# Patient Record
Sex: Male | Born: 1950 | Race: White | Hispanic: No | Marital: Married | Smoking: Former smoker
Health system: Southern US, Community
[De-identification: ages and names within clinical notes are randomized; demographics above are authoritative.]

## PROBLEM LIST (undated history)

## (undated) DIAGNOSIS — I10 Essential (primary) hypertension: Secondary | ICD-10-CM

## (undated) DIAGNOSIS — E785 Hyperlipidemia, unspecified: Secondary | ICD-10-CM

## (undated) DIAGNOSIS — G4733 Obstructive sleep apnea (adult) (pediatric): Secondary | ICD-10-CM

## (undated) DIAGNOSIS — Z8679 Personal history of other diseases of the circulatory system: Secondary | ICD-10-CM

## (undated) HISTORY — DX: Obstructive sleep apnea (adult) (pediatric): G47.33

## (undated) HISTORY — PX: ANKLE SURGERY: SHX546

## (undated) HISTORY — DX: Hyperlipidemia, unspecified: E78.5

## (undated) HISTORY — DX: Personal history of other diseases of the circulatory system: Z86.79

## (undated) HISTORY — PX: BACK SURGERY: SHX140

## (undated) HISTORY — DX: Essential (primary) hypertension: I10

## (undated) HISTORY — PX: CHOLECYSTECTOMY: SHX55

## (undated) HISTORY — PX: OTHER SURGICAL HISTORY: SHX169

---

## 2004-04-15 ENCOUNTER — Ambulatory Visit: Payer: Self-pay | Admitting: Family Medicine

## 2004-04-29 ENCOUNTER — Ambulatory Visit: Payer: Self-pay | Admitting: Family Medicine

## 2004-07-19 ENCOUNTER — Ambulatory Visit: Payer: Self-pay | Admitting: Family Medicine

## 2004-12-13 ENCOUNTER — Ambulatory Visit: Payer: Self-pay | Admitting: Family Medicine

## 2005-04-07 ENCOUNTER — Ambulatory Visit: Payer: Self-pay | Admitting: Family Medicine

## 2005-04-29 ENCOUNTER — Ambulatory Visit: Payer: Self-pay | Admitting: Family Medicine

## 2005-07-15 ENCOUNTER — Ambulatory Visit: Payer: Self-pay | Admitting: Family Medicine

## 2009-03-02 ENCOUNTER — Encounter: Payer: Self-pay | Admitting: Pulmonary Disease

## 2010-04-26 ENCOUNTER — Encounter: Payer: Self-pay | Admitting: Pulmonary Disease

## 2010-05-31 ENCOUNTER — Telehealth (INDEPENDENT_AMBULATORY_CARE_PROVIDER_SITE_OTHER): Payer: Self-pay | Admitting: *Deleted

## 2010-05-31 ENCOUNTER — Ambulatory Visit
Admission: RE | Admit: 2010-05-31 | Discharge: 2010-05-31 | Payer: Self-pay | Source: Home / Self Care | Attending: Pulmonary Disease | Admitting: Pulmonary Disease

## 2010-05-31 DIAGNOSIS — I10 Essential (primary) hypertension: Secondary | ICD-10-CM | POA: Insufficient documentation

## 2010-05-31 DIAGNOSIS — R0989 Other specified symptoms and signs involving the circulatory and respiratory systems: Secondary | ICD-10-CM

## 2010-05-31 DIAGNOSIS — E785 Hyperlipidemia, unspecified: Secondary | ICD-10-CM | POA: Insufficient documentation

## 2010-05-31 DIAGNOSIS — G4733 Obstructive sleep apnea (adult) (pediatric): Secondary | ICD-10-CM | POA: Insufficient documentation

## 2010-05-31 DIAGNOSIS — I4891 Unspecified atrial fibrillation: Secondary | ICD-10-CM | POA: Insufficient documentation

## 2010-05-31 DIAGNOSIS — R0609 Other forms of dyspnea: Secondary | ICD-10-CM | POA: Insufficient documentation

## 2010-05-31 DIAGNOSIS — J45909 Unspecified asthma, uncomplicated: Secondary | ICD-10-CM | POA: Insufficient documentation

## 2010-06-20 NOTE — Assessment & Plan Note (Signed)
Summary: consult for possible asthma, doe   Visit Type:  Initial Consult Copy to:  Rudolpho Sevin [facility] Cardiology) Primary Provider/Referring Provider:  Orson Gear MD  CC:  Pulmonary Consult. Pt being evalutaed for asthma. Pt states he has increased sob with activity, wheezing, and chest tightness x 3 years. .  History of Present Illness: The pt is a 59y/o male who I have been asked to see for possible asthma and dyspnea.  The pt has a h/o afib, and tells me he has noticed worsening sob since his ablation doen in 2005.  He describes a one block doe at a moderate pace on flat ground, and will get winded bringing groceries in from the car.  He denies any sob at rest.  He states that his sob has progressed to the point that it is hard for him to do his job.  He has some cough in the am's and late evenings, and it is primarily dry with scant nonpurulet mucus.  He is on quinapril currently, and has occasional reflux symptoms.  He was given qvar emperically for possible asthma, and thinks this may have helped his symptoms to some degree?  He has had pfts in 2011 which show no airflow obstruction, very mild restriction, and an essentially normal DLCO.  There is slight airtrapping on lung volumes with RV/TLC of unknown significance.  It should be noted the pt was on qvar at the time of his pfts, per his history.  His last cxr available to me was in 2010, and showed only mildly prominent BV markings.   He has not had a recent cardiac evaluation per his history.   Preventive Screening-Counseling & Management  Alcohol-Tobacco     Smoking Status: quit  Current Medications (verified): 1)  Flonase 50 Mcg/act  Susp (Fluticasone Propionate) .... Two Puffs Each Nostril Daily 2)  Qvar 80 Mcg/act  Aers (Beclomethasone Dipropionate) .... Two  Puffs Twice Daily 3)  Sotalol Hcl 80 Mg Tabs (Sotalol Hcl) .... Take 1/2 Tab By Mouth Two Times A Day 4)  Quinapril Hcl 10 Mg Tabs (Quinapril Hcl) .... Take 1 Tablet By  Mouth Two Times A Day 5)  Klor-Con 10 10 Meq Cr-Tabs (Potassium Chloride) .... Take 1 Tablet By Mouth Once A Day 6)  Hydrochlorothiazide 25 Mg Tabs (Hydrochlorothiazide) .... Take 1 Tablet By Mouth Once A Day 7)  Aspirin Low Dose 81 Mg Tabs (Aspirin) .... Take 1 Tablet By Mouth Once A Day 8)  Lipitor 40 Mg Tabs (Atorvastatin Calcium) .... Once Daily  Allergies (verified): No Known Drug Allergies  Past History:  Past Medical History:  OBSTRUCTIVE SLEEP APNEA (ICD-327.23)-on cpap Hx of ATRIAL FIBRILLATION (ICD-427.31)--s/p ablation 11/05 HYPERLIPIDEMIA (ICD-272.4) HYPERTENSION (ICD-401.9)     Past Surgical History: Cholecystectomy back surgery left ankle surgery ablasion from afib x 5  Family History: Reviewed history and no changes required. Heart disease: mother, father, mgm, mgf, pgf, pgm paternal aunt: ovarian cancer mgm, maternal aunt: breast cancer paternal aunt: pancreatic cancer  Social History: Reviewed history and no changes required. Patient states former smoker. quit in 1970's. 1/2 ppd. started in 1960. married Clinical research associate at [employer] Smoking Status:  quit  Review of Systems       The patient complains of shortness of breath with activity, shortness of breath at rest, productive cough, irregular heartbeats, and nasal congestion/difficulty breathing through nose.  The patient denies non-productive cough, coughing up blood, chest pain, acid heartburn, indigestion, loss of appetite, weight change, abdominal pain, difficulty swallowing, sore  throat, tooth/dental problems, headaches, sneezing, itching, ear ache, anxiety, depression, hand/feet swelling, joint stiffness or pain, rash, change in color of mucus, and fever.    Vital Signs:  Patient profile:   60 year old male Height:      68 inches Weight:      216.13 pounds BMI:     32.98 O2 Sat:      96 % on Room air Temp:     98.7 degrees F oral Pulse rate:   68 / minute BP sitting:   120  / 76  (left arm) Cuff size:   large  Vitals Entered By: Carver Fila (May 31, 2010 9:40 AM)  O2 Flow:  Room air CC: Pulmonary Consult. Pt being evalutaed for asthma. Pt states he has increased sob with activity, wheezing, chest tightness x 3 years.  Comments meds and alleries updated Phone number updated Carver Fila  May 31, 2010 9:40 AM    Physical Exam  General:  overweight male with centripetal obesity in nad Eyes:  anisocoria and strabismus.   Nose:  patent without discharge mildly narrowed. Mouth:  clear, no exudates or lesions seen Neck:  no jvd, tmg, LN Lungs:  totally clear to auscultation Heart:  rrr, no mrg Abdomen:  soft and nontender, bs+ Extremities:  mild edema, no cyanosis  pulses intact distally Neurologic:  alert and oriented, moves all 4.   Impression & Recommendations:  Problem # 1:  DYSPNEA ON EXERTION (ICD-786.09) the pt noted significant doe of unknown origin.  It is unclear if this is from a pulmonary source, but his exam and recent pfts are really unremarkable.  The question has been raised whether he may have asthma, but he really has not had a significant response to qvar.  I think he needs to have a methacholine challenge test to put this issue to rest.  I suspect that his obesity and deconditioning are contributing significantly to his symptoms.  My only other thought is that he is on quinapril, and sometimes upper airway dysfunction can lead to the "perception" of dyspnea.  I think it would be worthwhile getting him off ACE for period to time to see if it helps.    Medications Added to Medication List This Visit: 1)  Lipitor 40 Mg Tabs (Atorvastatin calcium) .... Once daily  Other Orders: Consultation Level V (01027) Pulmonary Referral (Pulmonary)  Patient Instructions: 1)  stop quinapril, and take benicar 20mg  1/2 tab each day in its place.  if blood pressure goes up, can increase to one a day 2)  stop qvar 3)  can take ventolin inhaler  2 puffs up to every 6hrs if needed for emergencies only 4)  will schedule for methacholine challenge testing in 3 weeks once qvar gets out of your system.  Will call to schedule followup once results available.   Immunization History:  Influenza Immunization History:    Influenza:  historical (01/17/2010)  Pneumovax Immunization History:    Pneumovax:  historical (02/17/2007)

## 2010-06-20 NOTE — Progress Notes (Signed)
  Phone Note Other Incoming   Request: Send information Summary of Call: Records received from [facility]. 26 pages forwarded to Dr. Shelle Iron for review.

## 2010-06-21 ENCOUNTER — Ambulatory Visit (HOSPITAL_COMMUNITY)
Admission: RE | Admit: 2010-06-21 | Discharge: 2010-06-21 | Disposition: A | Discharge status: Home / Self Care | Payer: Managed Care, Other (non HMO) | Source: Ambulatory Visit | Attending: Pulmonary Disease | Admitting: Pulmonary Disease

## 2010-06-21 ENCOUNTER — Encounter: Payer: Self-pay | Admitting: Pulmonary Disease

## 2010-06-21 DIAGNOSIS — R0602 Shortness of breath: Secondary | ICD-10-CM | POA: Insufficient documentation

## 2010-06-24 ENCOUNTER — Telehealth (INDEPENDENT_AMBULATORY_CARE_PROVIDER_SITE_OTHER): Payer: Self-pay | Admitting: *Deleted

## 2010-06-26 NOTE — Procedures (Signed)
Summary: Orson Gear MD  Orson Gear MD   Imported By: Lester [place] 06/18/2010 11:19:59  _____________________________________________________________________  External Attachment:    Type:   Image     Comment:   External Document

## 2010-06-28 ENCOUNTER — Encounter: Payer: Self-pay | Admitting: Pulmonary Disease

## 2010-06-28 ENCOUNTER — Ambulatory Visit (INDEPENDENT_AMBULATORY_CARE_PROVIDER_SITE_OTHER): Payer: Managed Care, Other (non HMO) | Admitting: Pulmonary Disease

## 2010-06-28 DIAGNOSIS — J45909 Unspecified asthma, uncomplicated: Secondary | ICD-10-CM

## 2010-07-04 NOTE — Progress Notes (Signed)
Summary: questions about his inhaler    Phone Note Call from Patient Call back at [phone]   Caller: Spouse//carol Call For: clance Summary of Call: States that pt was put on his emergency inhaler wants to know is he can resume using his regular inhaler pls advise. Initial call taken by: Darletta Moll,  June 24, 2010 3:21 PM  Follow-up for Phone Call        Spoke with pt wife and she states pt had a difficult time at end of methacoline test. he couldnt stop coughing and had increased SOB. She states he has continued to have increased dry cough and chest tightness and SOB since test and has had to increase use of rescue inhaler. She wants to know can he go back on the QVAR or what [facility] recs are. Methacoline test was done on 06-21-10.  Please advise.Carron Curie CMA  June 24, 2010 4:02 PM   Additional Follow-up for Phone Call Additional follow up Details #1::        can use rescue inhaler as needed have not seen results of meth challenge....can we get these faxed over?? Additional Follow-up by: [name] MD,  June 24, 2010 5:44 PM    Additional Follow-up for Phone Call Additional follow up Details #2::    Pt's wife instructed that pt is to use rescue inhaler as needed per Dr Shelle Iron.  [facility] called for results of Methacolin Challenge.  They are faxing results for Dr Shelle Iron to reiview.  Results placed in Dr Teddy Spike look at stack. Abigail Miyamoto RN  June 25, 2010 8:56 AM   Additional Follow-up for Phone Call Additional follow up Details #3:: Details for Additional Follow-up Action Taken: Aundra Millet, we need to get this pt in this week to discuss his meth challenge, and to get him started on a new med. ok to let him know it was positive.   [id number].  Aundra Millet Reynolds LPN  June 25, 2010 2:29 PM   Spoke with pt's spouse and advised that pt needs appt this wk to dicuss results.  She states that he is unable to come in this wk b/c can not take off of work.  KC's next  available is not until 07/16/10.  Pls advise if we can work him in sooner.  Spouse states that if we can work him in, early pm is best time.  Pls advise thanks! Vernie Murders  June 26, 2010 9:51 AM  called and spoke with pt's wife.  wife states pt will be off next friday and requests an appt then.  pt scheduled for 07-05-2010 at 9:45am.  Arman Filter LPN  June 26, 2010 10:10 AM   Additional Follow-up by: [name] MD,  June 25, 2010 2:25 PM

## 2010-07-04 NOTE — Assessment & Plan Note (Signed)
Summary: rov to discuss meth. challenge results.   Copy to:  Rudolpho Sevin [facility] Cardiology) Primary Provider/Referring Provider:  Orson Gear MD  CC:  Ov to discuss MCT results.  Denies changes to breathing- still sob with exertion.  Pt requests a rx for the Benicar. Marland Kitchen  History of Present Illness: the pt comes in today for f/u of his methacholine challenge test, ordered as part of his w/u for doe.  He was found to have a 22% decrease in FVC with the 1.0 dilution of methacholine.  +improvement with albuterol, but not back to normal.  I have reviewed the study in detail with him, and answered all of his questions.  He was also taken off ACE last visit, and has seen improvement in his cough/upper airway irritation.    Current Medications (verified): 1)  Flonase 50 Mcg/act  Susp (Fluticasone Propionate) .... Two Puffs Each Nostril Daily 2)  Sotalol Hcl 80 Mg Tabs (Sotalol Hcl) .... Take 1/2 Tab By Mouth Two Times A Day 3)  Benicar 20 Mg Tabs (Olmesartan Medoxomil) .... 1/2 Tab By Mouth Daily 4)  Klor-Con 10 10 Meq Cr-Tabs (Potassium Chloride) .... Take 1 Tablet By Mouth Once A Day 5)  Hydrochlorothiazide 25 Mg Tabs (Hydrochlorothiazide) .... Take 1 Tablet By Mouth Once A Day 6)  Aspirin Low Dose 81 Mg Tabs (Aspirin) .... Take 1 Tablet By Mouth Once A Day 7)  Lipitor 40 Mg Tabs (Atorvastatin Calcium) .... Once Daily 8)  Ventolin Hfa 108 (90 Base) Mcg/act  Aers (Albuterol Sulfate) .Marland Kitchen.. 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  The patient denies  shortness of breath at rest, coughing up blood, chest pain, irregular heartbeats, acid heartburn, indigestion, loss of appetite, weight change, abdominal pain, difficulty swallowing, sore throat, tooth/dental problems, headaches, nasal congestion/difficulty breathing through nose, itching, ear ache, anxiety, depression, hand/feet swelling, rash, change in color of mucus, and fever.    Vital Signs:  Patient profile:   60 year old male Height:      68 inches Weight:      223.25 pounds BMI:     34.07 O2 Sat:      95 % on Room air Temp:     97.9 degrees F oral Pulse rate:   67 / minute BP sitting:   134 / 90  (right arm) Cuff size:   large  Vitals Entered By: Arman Filter LPN (June 28, 2010 3:38 PM)  O2 Flow:  Room air CC: Ov to discuss MCT results.  Denies changes to breathing- still sob with exertion.  Pt requests a rx for the Benicar.  Comments Medications reviewed with patient Arman Filter LPN  June 28, 2010 3:43 PM    Physical Exam  General:  wd male in nad Lungs:  clear to auscultation Heart:  rrr Extremities:  mild edema, no cyanosis  Neurologic:  alert and oriented, moves all 4.    Impression & Recommendations:  Problem # 1:  ASTHMA (ICD-493.90) the pt has a positive methacholine challenge test, and I suspect asthma is playing a role with his symptoms.  Will treat him with a LABA/ICS combo first,  then decrease to ICS alone if doing well.  I have discussed with him the pathophysiology of asthma, and how it will require daily maintenance med even if he is feeling good.  The pt also has seen improvement in his cough and upper airway symptoms since being off ACE.  Problem # 2:  DYSPNEA ON EXERTION (ICD-786.09) I suspect that weight and deconditioning are also playing a role in his symptoms.  I will be curious to see how he does once his asthma is treated more aggressively.    Medications Added to Medication List This Visit: 1)  Benicar 20 Mg Tabs (Olmesartan  medoxomil) .... 1/2 tab by mouth daily 2)  Ventolin Hfa 108 (90 Base) Mcg/act Aers (Albuterol sulfate) .Marland Kitchen.. 1-2 puffs every 4-6 hours as needed 3)  Dulera 100-5 Mcg/act Aero (Mometasone furo-formoterol fum) .... 2 puffs twice per day  Other Orders: Est. Patient Level III (29562)  Patient Instructions: 1)  will treat with dulera 100/5  2 puffs am and pm everyday...rinse mouth well. 2)  can use albuterol inhaler as needed, but call me if having to use more than 2 times a week 3)  stay on benicar 20mg  1/2 tab once a day, but can increase to one whole tab a day if blood pressure is not controlled.  Please get with Dr. Benedetto Goad or your cardiologist to get this ok'ed. 4)  work on weight loss and conditioning. 5)  please call me in 2-3 weeks with your progress.  If doing well, will see you back in 3mos  Prescriptions: DULERA 100-5 MCG/ACT AERO (MOMETASONE FURO-FORMOTEROL FUM) 2 puffs twice per day  #1 x 6   Entered and Authorized by:   [name] MD   Signed by:   [name] MD on 06/28/2010   Method used:   Print then Give to Patient   RxID:   [id number]

## 2010-07-05 ENCOUNTER — Ambulatory Visit: Payer: Managed Care, Other (non HMO) | Admitting: Pulmonary Disease

## 2010-08-28 ENCOUNTER — Telehealth: Payer: Self-pay | Admitting: Pulmonary Disease

## 2010-08-28 NOTE — Telephone Encounter (Signed)
Spoke w/ Okey Regal and she was wanting to know if pt should continue the qvar or the dulera. According to OV note on 06/28/10 [facility] was treating pt w/ dulera and pt was given 6 refills. Okey Regal states that was all she needed to know and nothing further was needed

## 2010-08-29 ENCOUNTER — Telehealth: Payer: Self-pay | Admitting: Pulmonary Disease

## 2010-08-29 MED ORDER — ALBUTEROL SULFATE HFA 108 (90 BASE) MCG/ACT IN AERS: INHALATION_SPRAY | RESPIRATORY_TRACT | Status: DC

## 2010-08-29 NOTE — Telephone Encounter (Signed)
lmomtcb x1 

## 2010-08-29 NOTE — Telephone Encounter (Signed)
Spoke w/ pt wife and she states they had company over last night and the kids where into pt's things and now pt can't find his emergency inhaler. Pt needs his rescue inhaler called in bc they tried to find it today and could not. Advised pt wife rx was sent to pharmacy

## 2010-09-25 ENCOUNTER — Encounter: Payer: Self-pay | Admitting: Pulmonary Disease

## 2010-09-27 ENCOUNTER — Encounter: Payer: Self-pay | Admitting: Pulmonary Disease

## 2010-09-27 ENCOUNTER — Ambulatory Visit (INDEPENDENT_AMBULATORY_CARE_PROVIDER_SITE_OTHER): Payer: Managed Care, Other (non HMO) | Admitting: Pulmonary Disease

## 2010-09-27 VITALS — BP 114/72 | HR 72 | Temp 97.5°F | Ht 68.0 in | Wt 221.8 lb

## 2010-09-27 DIAGNOSIS — J45909 Unspecified asthma, uncomplicated: Secondary | ICD-10-CM

## 2010-09-27 NOTE — Progress Notes (Signed)
  Subjective:    Patient ID: Christopher Newman, male    DOB: October 18, 1950, 60 y.o.   MRN: [id number]  HPI The pt comes in today for f/u of his known asthma and doe.  He was started on dulera last visit, and feels his breathing is much better.  He has not had any "episodes", and rarely uses his rescue inhaler.  He still has some doe, and I suspect his deconditioning and weight are playing a role here.    Review of Systems  Constitutional: Negative for fever and unexpected weight change.  HENT: Positive for rhinorrhea and sneezing. Negative for ear pain, nosebleeds, congestion, sore throat, trouble swallowing, dental problem, postnasal drip and sinus pressure.   Eyes: Negative for redness and itching.  Respiratory: Positive for cough, shortness of breath and wheezing. Negative for chest tightness.   Cardiovascular: Positive for palpitations. Negative for leg swelling.  Gastrointestinal: Positive for nausea. Negative for vomiting.  Genitourinary: Negative for dysuria.  Musculoskeletal: Negative for joint swelling.  Skin: Negative for rash.  Neurological: Negative for headaches.  Hematological: Does not bruise/bleed easily.  Psychiatric/Behavioral: Negative for dysphoric mood. The patient is not nervous/anxious.        Objective:   Physical Exam Ow male in nad Chest clear to auscultation  Cor with rrr LE with no edema, no cyanosis  Alert and oriented, moves all 4        Assessment & Plan:

## 2010-09-27 NOTE — Assessment & Plan Note (Addendum)
The pt is doing better with his doe, and rarely uses his rescue inhaler.  He feels his doe is 40% better since starting dulera.  I think his asthma is well controlled, and that his persistent dyspnea is due to his obesity, deconditioning, and possibly his cardiac issue.  He is to followup with me in one year.

## 2010-09-27 NOTE — Patient Instructions (Addendum)
No change in asthma meds Work on weight loss and conditioning program followup with me in 6mos

## 2011-03-21 ENCOUNTER — Telehealth: Payer: Self-pay | Admitting: Pulmonary Disease

## 2011-03-21 ENCOUNTER — Other Ambulatory Visit: Payer: Self-pay | Admitting: Pulmonary Disease

## 2011-03-21 DIAGNOSIS — G4730 Sleep apnea, unspecified: Secondary | ICD-10-CM

## 2011-03-21 NOTE — Telephone Encounter (Signed)
Let pt know that I do not follow him for sleep apnea.  If Dr. Benedetto Goad wishes me to manage this issue, then she needs to formally refer the pt to me for sleep apnea.  In the interim, I am ok with ordering a mask just this one time.

## 2011-03-21 NOTE — Telephone Encounter (Signed)
Spoke with pt's spouse Okey Regal. She states that pt's CPAP mask has a tear in it and he needs order for new mask asap. I advised that he sees [facility] for pulmonary issues and not sleep and so will have to ask if this is okay. She states that she thought that Dr. Benedetto Goad started on CPAP, but when she called there to get order from her, she advised to call us for the order. Please advise, thanks!

## 2011-03-24 NOTE — Telephone Encounter (Signed)
[id number].Christopher Newman, CMA  

## 2011-03-24 NOTE — Telephone Encounter (Signed)
ATC line was busy, [facility]

## 2011-03-24 NOTE — Telephone Encounter (Signed)
SPOUSE CAROL RETURNED CALL FROM TRIAGE. [phone]. Christopher Newman

## 2011-03-25 NOTE — Telephone Encounter (Signed)
Pt's spouse aware KC will send an order for the CPAP mask this time only. She verbalized understanding that if Dr. Benedetto Goad wishes for [facility] to follow the pt's sleep apnea, she will need to refer the pt to [facility] for that issue. Order sent to [facility].

## 2011-04-04 ENCOUNTER — Ambulatory Visit: Payer: Managed Care, Other (non HMO) | Admitting: Pulmonary Disease

## 2011-04-18 ENCOUNTER — Ambulatory Visit: Payer: Managed Care, Other (non HMO) | Admitting: Pulmonary Disease

## 2011-05-30 ENCOUNTER — Ambulatory Visit (INDEPENDENT_AMBULATORY_CARE_PROVIDER_SITE_OTHER): Payer: Managed Care, Other (non HMO) | Admitting: Pulmonary Disease

## 2011-05-30 ENCOUNTER — Encounter: Payer: Self-pay | Admitting: Pulmonary Disease

## 2011-05-30 VITALS — BP 120/80 | HR 74 | Temp 97.5°F | Ht 68.0 in | Wt 219.4 lb

## 2011-05-30 DIAGNOSIS — J45909 Unspecified asthma, uncomplicated: Secondary | ICD-10-CM

## 2011-05-30 MED ORDER — PREDNISONE (PAK) 10 MG PO TABS: ORAL_TABLET | ORAL | Status: DC

## 2011-05-30 NOTE — Assessment & Plan Note (Signed)
The patient had been doing well on his current asthma regimen, however had a recent episode of acute bronchitis.  He is now having a little more trouble with airflow and dyspnea on exertion.  I suspect he has ongoing airway inflammation that has not returned to baseline after his recent infection.  Ultram was short course of prednisone, and Avastin to continue with his inhaler regimen for maintenance.  I have also encouraged him to work aggressively on weight loss.

## 2011-05-30 NOTE — Patient Instructions (Signed)
Stay on current inhalers. Prednisone as directed Can take chlorpheniramine 8mg  at bedtime for postnasal drip followup with me in 6mos if you are doing well.

## 2011-05-30 NOTE — Progress Notes (Signed)
  Subjective:    Patient ID: Christopher Newman, male    DOB: 1951-02-21, 61 y.o.   MRN: [id number]  HPI The patient comes in today for followup of his known asthma.  He had been doing well until the end of December when he developed acute bronchitis.  He was treated with an antibiotic, but his breathing has not returned to baseline.  He has had some episodes of chest tightness that has required him to use his rescue inhaler.  Part of the issues that he has been under his house doing work, and exposed to various particulate matter.  He also has not made a lot of progress with weight loss, and is not doing any type of conditioning program.   Review of Systems  Constitutional: Negative for fever and unexpected weight change.  HENT: Negative for ear pain, nosebleeds, congestion, sore throat, rhinorrhea, sneezing, trouble swallowing, dental problem, postnasal drip and sinus pressure.   Eyes: Positive for pain. Negative for redness and itching.  Respiratory: Positive for cough, chest tightness, shortness of breath and wheezing.   Cardiovascular: Positive for palpitations. Negative for leg swelling.  Gastrointestinal: Negative for nausea and vomiting.  Genitourinary: Negative for dysuria.  Musculoskeletal: Negative for joint swelling.  Skin: Negative for rash.  Neurological: Negative for headaches.  Hematological: Does not bruise/bleed easily.  Psychiatric/Behavioral: Negative for dysphoric mood. The patient is not nervous/anxious.        Objective:   Physical Exam Overweight male in no acute distress Nose without purulence or discharge noted Chest with good air flow bilaterally, no wheezes.  Occasional rhonchi Cardiac exam with regular rate and rhythm Lower extremities no significant edema, no cyanosis noted Alert and oriented, moves all 4 extremities.       Assessment & Plan:

## 2011-05-30 NOTE — Progress Notes (Signed)
Addended by: Julaine Hua on: 05/30/2011 10:48 AM   Modules accepted: Orders

## 2011-07-04 ENCOUNTER — Other Ambulatory Visit: Payer: Self-pay | Admitting: Pulmonary Disease

## 2011-08-13 ENCOUNTER — Encounter: Payer: Self-pay | Admitting: Internal Medicine

## 2011-08-13 ENCOUNTER — Telehealth: Payer: Self-pay | Admitting: Internal Medicine

## 2011-08-13 ENCOUNTER — Encounter: Payer: Self-pay | Admitting: *Deleted

## 2011-08-13 ENCOUNTER — Ambulatory Visit (INDEPENDENT_AMBULATORY_CARE_PROVIDER_SITE_OTHER)
Admission: RE | Admit: 2011-08-13 | Discharge: 2011-08-13 | Disposition: A | Discharge status: Home / Self Care | Payer: Managed Care, Other (non HMO) | Source: Ambulatory Visit | Attending: Internal Medicine | Admitting: Internal Medicine

## 2011-08-13 ENCOUNTER — Ambulatory Visit (INDEPENDENT_AMBULATORY_CARE_PROVIDER_SITE_OTHER): Payer: Managed Care, Other (non HMO) | Admitting: Internal Medicine

## 2011-08-13 VITALS — BP 132/78 | HR 91 | Temp 99.1°F | Ht 69.0 in | Wt 222.0 lb

## 2011-08-13 DIAGNOSIS — J45909 Unspecified asthma, uncomplicated: Secondary | ICD-10-CM

## 2011-08-13 MED ORDER — DOXYCYCLINE HYCLATE 100 MG PO TABS: 100.0000 mg | ORAL_TABLET | Freq: Two times a day (BID) | ORAL | Status: DC

## 2011-08-13 MED ORDER — TRAMADOL HCL 50 MG PO TABS: ORAL_TABLET | ORAL | Status: AC

## 2011-08-13 MED ORDER — PREDNISONE (PAK) 10 MG PO TABS: ORAL_TABLET | ORAL | Status: DC

## 2011-08-13 NOTE — Progress Notes (Signed)
Quick Note:  Spoke with pt and notified of results per Dr. Wert. Pt verbalized understanding and denied any questions.  ______ 

## 2011-08-13 NOTE — Progress Notes (Addendum)
Subjective:     Patient ID: Christopher Newman, male   DOB: 05-24-1950   MRN: [id number]  HPI  39 yowm remote smoker  with dx of ACEI induced cough 05/2010 and dx  of asthma on MCT 2/012 ? (On sotalol at the time) started on dulera 100 2bid but breathing "never back to normal" even with use of ventolin prn and continued sotalol at 40 mg bid dosing.   08/13/2011 Saryah Loper/ acute w/in ov for Dr Shelle Iron c/o Increased SOB mostly with activity and cough x 4 days- cough prod with large amounts of clear to yellow sputum. Had fever to 101.5 one night prior to ov and increase sob requiring rescue saba up to 4 x in 24 h with some improvement s cp/ sore throat, rigors, n or v or D or arthralgias/ myalgias.      Review of Systems     Objective:   Physical Exam Obese amb wm nad  Wt 222 08/13/2011   HEENT: nl dentition, turbinates, and orophanx. Nl external ear canals without cough reflex   NECK :  without JVD/Nodes/TM/ nl carotid upstrokes bilaterally   LUNGS: no acc muscle use,  insp and exp rhonchi bilaterally though air movement ok   CV:  RRR  no s3 or murmur or increase in P2, no edema   ABD:  soft and nontender with nl excursion in the supine position. No bruits or organomegaly, bowel sounds nl  MS:  warm without deformities, calf tenderness, cyanosis or clubbing  SKIN: warm and dry without lesions    NEURO:  alert, approp, no deficits   CXR  08/13/2011 : slt increased markings, no def as dz     Assessment:          Plan:

## 2011-08-13 NOTE — Patient Instructions (Signed)
Your sotalol may be contributing to your breathing problems but for now would not change the dose - will send Dr Rudolpho Sevin a copy of my recs  Doxycyline 100 mg twice daily x 7 days  Prednisone 10 mg take  4 each am x 2 days,   2 each am x 2 days,  1 each am x2days and stop  For cough or pain take tramadol 50 mg 1-2  Up to every 4 hours as needed  Please remember to go to the x-ray department downstairs for your tests - we will call you with the results when they are available.    Christopher Newman

## 2011-08-13 NOTE — Assessment & Plan Note (Addendum)
DDX of  difficult airways managment all start with A and  include Adherence, Ace Inhibitors, Acid Reflux, Active Sinus Disease, Alpha 1 Antitripsin deficiency, Anxiety masquerading as Airways dz,  ABPA,  allergy(esp in young), Aspiration (esp in elderly), Adverse effects of DPI,  Active smokers, plus two Bs  = Bronchiectasis and Beta blocker use..and one C= CHF  Beta blocker(sotalol)  use  may be problematic in this setting though apparently has been tolerated well albeit on dulera.  Will defer to Dr Sandy Salaam  For now rx as uri with doxy and round of prednisone and control cough/ uri symptoms with ultram since afraid to use otcs due to heart rhythm issues

## 2011-08-14 ENCOUNTER — Telehealth: Payer: Self-pay | Admitting: Pulmonary Disease

## 2011-08-14 MED ORDER — LEVOFLOXACIN 750 MG PO TABS: 750.0000 mg | ORAL_TABLET | Freq: Every day | ORAL | Status: AC

## 2011-08-14 NOTE — Telephone Encounter (Signed)
Pt denies any sinus pressure and has very little sinus drainage or congestion.

## 2011-08-14 NOTE — Telephone Encounter (Signed)
Let him know that he has not really given the doxycycline a chance to kick in.  That said, he may need a stronger abx for his particular infection. Would have him give this another 24hrs, and if no better, would fill a prescription that we will call in today. levaquin 750mg  one a day for 7 days.   Unfortunately, no office hours tomorrow or weekend.  If gets worse, will need to go to ER

## 2011-08-14 NOTE — Telephone Encounter (Signed)
Pt and spouse aware of KC recs and will give the doxycycline another 24 hours. If the pt is no better, pt can then fill the Levaquin sent to the pharmacy today. If pt gets worse over the holiday weekend he should go to the ER. Spouse verbalized understanding.

## 2011-08-14 NOTE — Telephone Encounter (Signed)
Pt states cough is worse than when here for acute ov with MW yesterday. Coughing frequently, mucus is yellow. He has started the doxycycline, Prednisone taper and Tramadol for cough or pain. He says they called to let him know his CXR did not show any pneumonia. Pt now has fever which is staying between 101 and 102.7. This started last night. Pt wants recs from Dr. Shelle Iron. Pls advise.No Known Allergies

## 2011-08-14 NOTE — Telephone Encounter (Signed)
Is he having a lot of sinus pressure and congestion?

## 2011-08-18 NOTE — Telephone Encounter (Signed)
He needs an ov with cxr tomorrow.  Need to find a place for him.  In the meantime, go to ER if he worsens next 24hrs.

## 2011-08-18 NOTE — Telephone Encounter (Signed)
I spoke with pt and he stated he is still not feeling any better from when he was in to see MW on 08/13/11. He c/o still being short winded, a lot of coughing w/ clear to yellow phlem, wheezing, and sweats. Denies any sore throat, fever, chills, body aches. Pt is still on levaquin 750 mg and has 3 days left and is still on his pred taper. Pt is requesting further recs from [facility]. No available openings today. Please advise KC, thanks  No Known Allergies

## 2011-08-18 NOTE — Telephone Encounter (Signed)
His last cxr was abnormal, since he is not better needs followup

## 2011-08-18 NOTE — Telephone Encounter (Signed)
I spoke with spouse and is aware of KC recs. She wanted me to double check with [facility] to make sure it's okay for him to have another cxr when he just had one on 08/13/11. Please advise KC thanks

## 2011-08-18 NOTE — Telephone Encounter (Signed)
Per [facility] he still does need to the CXR . i made wife aware of this and she voiced her understanding and needed nothing further

## 2011-08-18 NOTE — Telephone Encounter (Signed)
Per JJ ok use TP's 10 am slot tomorrow 4/2.  LMTCB

## 2011-08-19 ENCOUNTER — Ambulatory Visit (INDEPENDENT_AMBULATORY_CARE_PROVIDER_SITE_OTHER)
Admission: RE | Admit: 2011-08-19 | Discharge: 2011-08-19 | Disposition: A | Discharge status: Home / Self Care | Payer: Managed Care, Other (non HMO) | Source: Ambulatory Visit | Attending: Adult Health | Admitting: Adult Health

## 2011-08-19 ENCOUNTER — Ambulatory Visit (INDEPENDENT_AMBULATORY_CARE_PROVIDER_SITE_OTHER): Payer: Managed Care, Other (non HMO) | Admitting: Adult Health

## 2011-08-19 ENCOUNTER — Encounter: Payer: Self-pay | Admitting: Adult Health

## 2011-08-19 ENCOUNTER — Other Ambulatory Visit: Payer: Self-pay | Admitting: Adult Health

## 2011-08-19 VITALS — BP 114/80 | HR 68 | Temp 96.9°F | Ht 69.0 in | Wt 216.2 lb

## 2011-08-19 DIAGNOSIS — J45909 Unspecified asthma, uncomplicated: Secondary | ICD-10-CM

## 2011-08-19 MED ORDER — HYDROCODONE-HOMATROPINE 5-1.5 MG/5ML PO SYRP: 5.0000 mL | ORAL_SOLUTION | Freq: Four times a day (QID) | ORAL | Status: AC | PRN

## 2011-08-19 NOTE — Assessment & Plan Note (Addendum)
Recent exacerbation with Bronchiits +/- early PNA  CXR pending today  xopenex neb in office   Plan;  Finish Levaquin and Prednisone  Mucinex DM twice daily as needed for cough and congestion. Fluids and rest. Tylenol as needed. I will call the chest x-ray results. Follow up with Dr. Shelle Iron in 1 month and As needed

## 2011-08-19 NOTE — Patient Instructions (Signed)
Finish Levaquin and prednisone as directed. Mucinex DM twice daily as needed for cough and congestion. Fluids and rest. Tylenol as needed. I will call the chest x-ray results. Follow up with Dr. Shelle Iron in 1 month and As needed

## 2011-08-19 NOTE — Progress Notes (Signed)
Addended by: Boone Master E on: 08/19/2011 10:48 AM   Modules accepted: Orders

## 2011-08-19 NOTE — Progress Notes (Signed)
Subjective:     Patient ID: Christopher Newman, male   DOB: 03/14/1951   MRN: [id number]  HPI  40 yowm remote smoker  with dx of ACEI induced cough 05/2010 and dx  of asthma on MCT 2/012 ? (On sotalol at the time) started on dulera 100 2bid but breathing "never back to normal" even with use of ventolin prn and continued sotalol at 40 mg bid dosing.   08/13/2011 Wert/ acute w/in ov for Dr Shelle Iron c/o Increased SOB mostly with activity and cough x 4 days- cough prod with large amounts of clear to yellow sputum. Had fever to 101.5 one night prior to ov and increase sob requiring rescue saba up to 4 x in 24 h with some improvement s cp/ sore throat, rigors, n or v or D or arthralgias/ myalgias.   >>Doxycycline , CXR   08/19/2011 Follow up  Seen last week with cough and congestion , CXR showed increased interstitial markings. He was given Doxycycline. Had no improvement . Called back and was called in Levaquin and steroid taper. He is feeling better w/ decreased discolored mucus. Does continue to have a barky cough and intermittent wheezing. Has been taking tramadol, but does not feel that is helping. He denies any hemoptysis, chest pain, orthopnea, PND, or leg swelling. Has a 2 days left of Levaquin . Finishes Prednisone today.    Review of Systems Constitutional:   No  weight loss, night sweats,  Fevers, chills,  +fatigue, or  lassitude.  HEENT:   No headaches,  Difficulty swallowing,  Tooth/dental problems, or  Sore throat,                No sneezing, itching, ear ache, +nasal congestion, post nasal drip,   CV:  No chest pain,  Orthopnea, PND, swelling in lower extremities, anasarca, dizziness, palpitations, syncope.   GI  No heartburn, indigestion, abdominal pain, nausea, vomiting, diarrhea, change in bowel habits, loss of appetite, bloody stools.   Resp:   No coughing up of blood.    No chest wall deformity  Skin: no rash or lesions.  GU: no dysuria, change in color of urine, no urgency or  frequency.  No flank pain, no hematuria   MS:  No joint pain or swelling.  No decreased range of motion.  No back pain.  Psych:  No change in mood or affect. No depression or anxiety.  No memory loss.         Objective:   Physical Exam Obese amb wm nad  Wt 222 08/13/2011 >216 08/19/2011   HEENT: nl dentition, turbinates, and orophanx. Nl external ear canals without cough reflex   NECK :  without JVD/Nodes/TM/ nl carotid upstrokes bilaterally   LUNGS: no acc muscle use, coarse BS with few exp wheezes    CV:  RRR  no s3 or murmur or increase in P2, no edema   ABD:  soft and nontender with nl excursion in the supine position. No bruits or organomegaly, bowel sounds nl  MS:  warm without deformities, calf tenderness, cyanosis or clubbing  SKIN: warm and dry without lesions    NEURO:  alert, approp, no deficits   CXR  08/13/2011 : slt increased markings, no def as dz     Assessment:          Plan:

## 2011-08-25 NOTE — Progress Notes (Signed)
Ov reviewed.  Agree with plan as outlined.  

## 2011-09-18 ENCOUNTER — Encounter: Payer: Self-pay | Admitting: Pulmonary Disease

## 2011-09-18 ENCOUNTER — Ambulatory Visit (INDEPENDENT_AMBULATORY_CARE_PROVIDER_SITE_OTHER): Payer: Managed Care, Other (non HMO) | Admitting: Pulmonary Disease

## 2011-09-18 VITALS — BP 130/76 | HR 70 | Temp 97.9°F | Ht 69.0 in | Wt 223.2 lb

## 2011-09-18 DIAGNOSIS — R0609 Other forms of dyspnea: Secondary | ICD-10-CM

## 2011-09-18 DIAGNOSIS — J45909 Unspecified asthma, uncomplicated: Secondary | ICD-10-CM

## 2011-09-18 NOTE — Progress Notes (Signed)
  Subjective:    Patient ID: Christopher Newman, male    DOB: 07-18-1950, 61 y.o.   MRN: [id number]  HPI The patient comes in today for followup of his dyspnea on exertion.  He has been seen by one of my partners and also our nurse practitioner, and treated with antibiotics and prednisone tapers.  Despite this, he continues to have significant dyspnea on exertion, and is getting to the point of interfering with his job.  He is staying on his asthma medications religiously.  He is scheduled to see his cardiologist in the morning.  He was taken off sotalol for a period of time, however had a worsening of his arrhythmia, and did not see any difference in his breathing.  He is now back on this at all, and continues to have significant dyspnea on exertion.  He denies any significant cough, congestion, or purulent mucus.   Review of Systems  Constitutional: Negative for fever and unexpected weight change.  HENT: Negative for ear pain, nosebleeds, congestion, sore throat, rhinorrhea, sneezing, trouble swallowing, dental problem, postnasal drip and sinus pressure.   Eyes: Negative for redness and itching.  Respiratory: Positive for chest tightness and shortness of breath. Negative for cough and wheezing.   Cardiovascular: Positive for leg swelling. Negative for palpitations.  Gastrointestinal: Negative for nausea and vomiting.  Genitourinary: Negative for dysuria.  Musculoskeletal: Negative for joint swelling.  Skin: Negative for rash.  Neurological: Negative for headaches.  Hematological: Does not bruise/bleed easily.  Psychiatric/Behavioral: Negative for dysphoric mood. The patient is not nervous/anxious.        Objective:   Physical Exam Obese male in no acute distress Nose without purulence or discharge noted Chest with totally clear lung fields, no wheezing Cardiac exam with regular rate and rhythm Lower extremities with no significant edema, no cyanosis Alert and oriented, moves all 4  extremities.       Assessment & Plan:

## 2011-09-18 NOTE — Assessment & Plan Note (Signed)
The patient continues to have significant dyspnea on exertion that is interfering with his ability to work.  His lungs were totally clear to auscultation, and he is on an excellent asthma medication.  He has not seen a stabilization of his breathing despite being treated with antibiotics and prednisone.  I feel that his asthma has nothing to do with his current symptoms, and would find it more likely he has a cardiac source.  The patient is also obese and deconditioned which can also contribute to his symptoms.  If he has further cardiac evaluation, and nothing of significance is found, I would consider repeating his PFTs to compare to the past, and also do a CT scan of the chest to evaluate lung parenchyma and to rule out thromboembolic disease.

## 2011-09-18 NOTE — Patient Instructions (Signed)
Stay on dulera I think it is very unlikely your current symptoms are coming from your asthma Keep your appointment with your cardiologist for in the am, but call if further cardiac testing is unrevealing. If things improve and you are doing well, followup with me in 4mos.

## 2011-11-03 ENCOUNTER — Inpatient Hospital Stay: Admit: 2011-11-03 | Payer: Self-pay | Admitting: Orthopaedic Surgery

## 2011-11-03 SURGERY — INSERTION, INTRAMEDULLARY ROD, FEMUR
Anesthesia: General | Laterality: Left

## 2011-12-12 ENCOUNTER — Ambulatory Visit: Payer: Managed Care, Other (non HMO) | Admitting: Pulmonary Disease

## 2011-12-22 ENCOUNTER — Other Ambulatory Visit (INDEPENDENT_AMBULATORY_CARE_PROVIDER_SITE_OTHER): Payer: Managed Care, Other (non HMO)

## 2011-12-22 ENCOUNTER — Encounter: Payer: Self-pay | Admitting: Pulmonary Disease

## 2011-12-22 ENCOUNTER — Ambulatory Visit (INDEPENDENT_AMBULATORY_CARE_PROVIDER_SITE_OTHER): Payer: Managed Care, Other (non HMO) | Admitting: Pulmonary Disease

## 2011-12-22 ENCOUNTER — Other Ambulatory Visit: Payer: Managed Care, Other (non HMO)

## 2011-12-22 VITALS — HR 71 | Temp 97.6°F | Ht 68.0 in | Wt 220.0 lb

## 2011-12-22 DIAGNOSIS — R918 Other nonspecific abnormal finding of lung field: Secondary | ICD-10-CM | POA: Insufficient documentation

## 2011-12-22 DIAGNOSIS — J45909 Unspecified asthma, uncomplicated: Secondary | ICD-10-CM

## 2011-12-22 NOTE — Progress Notes (Signed)
  Subjective:    Patient ID: Christopher Newman, male    DOB: [birth year], 61 y.o.   MRN: [id number]  HPI The patient comes in today for an acute sick visit.  He has known history of asthma, and is maintained on excellent medication for this.  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  The patient also has a history of paroxysmal atrial fibrillation which has been difficult to control most recently.  He has had chronic dyspnea on exertion which I have felt more secondary to his underlying heart disease than his asthma.  He was fairly stable until June of this year when he began to have worsening of his atrial fibrillation with a rapid ventricular response.  He was apparently seen by his cardiologist, and underwent DC cardioversion.  He continued to have issues with shortness of breath, and also paroxysmal atrial fibrillation, and then in July of this year developed a fever as high as 101 with worsening shortness of breath.  He had no cough, no congestion, and no mucus production.  He was seen at [facility] where a chest x-ray showed worsening bilateral infiltrates, unclear how much represent edema versus infection.  He underwent a CT scan of his chest which showed bilateral groundglass opacities, superimposed over his known chronic changes in the left lung.  He also had an enlarging left pleural effusion.  He was admitted to the hospital and treated with IV antibiotics and discharged.  Since that time, he has continued to have significant fatigue and dyspnea on exertion.  He has also continued to have intermittent atrial fibrillation with rapid rate, especially at night upon lying down.  He has not had a followup chest x-ray or CT since last month.  He denies any cough or congestion currently, but is very worried about his ability to return to work with his significant dyspnea on exertion.   Review of Systems    Constitutional: Negative.  Negative for fever and unexpected weight change.  HENT: Negative.  Negative for ear pain, nosebleeds, congestion, sore throat, rhinorrhea, sneezing, trouble swallowing, dental problem, postnasal drip and sinus pressure.   Eyes: Negative.  Negative for redness and itching.  Respiratory: Positive for cough, shortness of breath and wheezing. Negative for chest tightness.   Cardiovascular: Negative.  Negative for palpitations and leg swelling.  Gastrointestinal: Negative.  Negative for nausea and vomiting.  Genitourinary: Negative.  Negative for dysuria.  Musculoskeletal: Negative.  Negative for joint swelling.  Skin: Negative for rash.  Neurological: Negative.  Negative for headaches.  Hematological: Negative.  Does not bruise/bleed easily.  Psychiatric/Behavioral: Negative.  Negative for dysphoric mood. The patient is not nervous/anxious.        Objective:   Physical Exam Obese male in no acute distress Nose without purulence or discharge noted Oropharynx clear, no thrush Neck without thyromegaly or adenopathy. Chest with bibasilar crackles, excellent airflow bilaterally with no wheezing, no rhonchi.  Mildly decreased breath sounds at the left base. Cardiac exam with regular rate and rhythm, 2/6 systolic murmur Abdomen soft, nontender, bowel sounds positive Lower extremities with mild edema, no cyanosis. Alert and oriented, moves all 4 extremities.       Assessment & Plan:

## 2011-12-22 NOTE — Assessment & Plan Note (Signed)
The patient has bilateral groundglass opacities on his CT scan last month that appear most consistent with pulmonary edema.  However, he did have a fever on admission, but had no other symptoms of pneumonia.  He felt a little better with the antibiotics, but has not returned to baseline and remains very dyspneic.  He may have had both edema and a pulmonary infection.  At this point, he continues to have paroxysmal atrial fibrillation which leads to worsening shortness of breath.  It is unclear to me how much of his persistent shortness of breath is cardiac versus pulmonary.  I would like to check a BNP, as well as a followup CT chest after he has been treated with antibiotics.  If his CT chest is most consistent with edema, I will need to speak with his cardiologist.

## 2011-12-22 NOTE — Assessment & Plan Note (Signed)
The patient's asthma appears to be well controlled currently.  He has no bronchospasm on exam with excellent airflow.  I do not feel this has anything to do with his dyspnea currently.

## 2011-12-22 NOTE — Patient Instructions (Addendum)
Will do ct chest to evaluate your lungs after treatment for possible pneumonia Will check bloodwork to see how much heart failure you may have. Will call you with the results once available.

## 2011-12-23 ENCOUNTER — Other Ambulatory Visit: Payer: Managed Care, Other (non HMO)

## 2011-12-23 ENCOUNTER — Ambulatory Visit (INDEPENDENT_AMBULATORY_CARE_PROVIDER_SITE_OTHER)
Admission: RE | Admit: 2011-12-23 | Discharge: 2011-12-23 | Disposition: A | Discharge status: Home / Self Care | Payer: Managed Care, Other (non HMO) | Source: Ambulatory Visit | Attending: Pulmonary Disease | Admitting: Pulmonary Disease

## 2011-12-23 DIAGNOSIS — R918 Other nonspecific abnormal finding of lung field: Secondary | ICD-10-CM

## 2011-12-24 ENCOUNTER — Telehealth: Payer: Self-pay | Admitting: Pulmonary Disease

## 2011-12-24 NOTE — Telephone Encounter (Signed)
Pt is calling and requesting pt's lab and CT results. lmomtcb x1 for spouse to make her aware will call once [facility] reviews these results. Will forward to [facility] as well so he is aware spouse calling for results. Please advise thanks

## 2011-12-24 NOTE — Telephone Encounter (Signed)
ATC spouse to make aware of this but NA wcb

## 2011-12-24 NOTE — Telephone Encounter (Signed)
Talked with wife at great length.  His ct much improved from July.  He clearly had chf back then.  Right lung now totally clear, and left lung with chronic process from 2010 (hard to know if any worse).  Obesity and conditioning also major factors here.  I think his PAF is the main reason for his worsening doe.  Will speak with his cardiologist about more aggressive treatment.   I will consider bronch for left sided process, but again has been present since 2010 by cxr. Will call pt once I speak with his cardiologist.

## 2011-12-24 NOTE — Telephone Encounter (Signed)
I plan on calling pt today after office when I review his film and compare to last one.  Will not leave VM due to HIPPA.

## 2011-12-24 NOTE — Telephone Encounter (Signed)
Spouse would like a call back at [phone] or [phone]. Okay to leave detailed message on VM since they are in the process of moving. Please advise KC thanks

## 2011-12-24 NOTE — Telephone Encounter (Signed)
Pt's spouse called [place] back.  Call her @ [phone] Leanora Ivanoff

## 2011-12-24 NOTE — Telephone Encounter (Signed)
Pt's wife aware KC will call she can be reached at [phone].Christopher Newman

## 2011-12-26 ENCOUNTER — Telehealth: Payer: Self-pay | Admitting: Pulmonary Disease

## 2011-12-26 NOTE — Telephone Encounter (Signed)
I have tried to call his office multiple times, and either they are closed or I sit on hold on the doctor line and no one picks up.  Will try again on Monday.

## 2011-12-26 NOTE — Telephone Encounter (Signed)
Wants to know if [facility] has spoken with Dr Rudolpho Sevin yet.Christopher Newman

## 2011-12-26 NOTE — Telephone Encounter (Signed)
Error.Christopher Newman ° °

## 2011-12-26 NOTE — Telephone Encounter (Signed)
LMOM TCB x1 to inform pt/wife that [facility] has attempted to call Dr Ralene Bathe office.  Will forward back to [facility].

## 2011-12-29 ENCOUNTER — Telehealth: Payer: Self-pay | Admitting: Pulmonary Disease

## 2011-12-29 NOTE — Telephone Encounter (Signed)
Please let pt know that i have called his cardiology office multiple times this am with no success.  Have been put on terminal hold, hung up on, and sent to answering machines.  I finally got thru to billing and asked them to pass a message on to Dr. Linton Ham to call me.  I will call him as soon as I hear from Dr. Linton Ham.

## 2011-12-29 NOTE — Telephone Encounter (Signed)
Pt returned call.  Advised pt that [facility] has attempted to reach Dr. Ralene Bathe office multiple times & that [name] will attempt to reach someone again on today, 12/29/11.  Pt verbalized understanding.  Christopher Newman

## 2011-12-29 NOTE — Telephone Encounter (Signed)
Pt called back again. Christopher Newman °

## 2011-12-29 NOTE — Telephone Encounter (Signed)
I spoke with Christopher Newman and is aware of this. He stated Dr. Linton Ham has him to return to work on 01/02/12. Per Christopher Newman he does not feel like he is ready bc he is still very SOB, He is wanting to know if [facility] will write a note to extend him to be out longer. Per Christopher Newman Dr. Linton Ham is not in the office today and is not sure when he will return. Please advise KC thanks

## 2011-12-29 NOTE — Telephone Encounter (Signed)
lmomtcb x1 

## 2011-12-30 ENCOUNTER — Other Ambulatory Visit: Payer: Self-pay | Admitting: Pulmonary Disease

## 2011-12-30 DIAGNOSIS — R0600 Dyspnea, unspecified: Secondary | ICD-10-CM | POA: Insufficient documentation

## 2011-12-30 NOTE — Telephone Encounter (Signed)
I have already spoke with him about this, and he was to bring paperwork that needs to be filled out to [employer].  Did he not understand this, or has something changed?

## 2011-12-31 ENCOUNTER — Telehealth: Payer: Self-pay | Admitting: Pulmonary Disease

## 2011-12-31 NOTE — Telephone Encounter (Signed)
LM with pt sister in law to have pt return our call.

## 2011-12-31 NOTE — Telephone Encounter (Signed)
Done.  In triage.

## 2011-12-31 NOTE — Telephone Encounter (Signed)
I spoke with pt and is aware letter placed upfront for pick up

## 2011-12-31 NOTE — Telephone Encounter (Signed)
Duplicate message see phone note 12/29/11

## 2011-12-31 NOTE — Telephone Encounter (Signed)
I spoke with pt and he stated he has not brought paperwork by yet bc he wants to pick the letter up first then will take paperwork down. He stated he is having a test done over at [facility] tomorrow morning and after this he will come by. He would like to pick the letter up then. Please advise KC thanks

## 2011-12-31 NOTE — Telephone Encounter (Signed)
Patient calling back about same as before.  [phone]

## 2011-12-31 NOTE — Telephone Encounter (Signed)
Spoke with patient-he has paperwork for [employer] to fill out. Pt states that he needs a letter still written TODAY to send in to company stating that he is out of work from August 16 through whenever [facility] wants him to return. Pt is aware that [facility] is seeing patients today and it may not be ready today-will try our best. Pt states his job is staying on him about getting the letter today and doesn't want to be a bother to anyone. Pt understands we are here to assist and help him-that he is not a bother.   Pt would like a call back at [phone] when letter is ready so he can make 1 trip here to get letter and drop papers off in [employer].

## 2012-01-01 ENCOUNTER — Ambulatory Visit (HOSPITAL_COMMUNITY): Payer: Managed Care, Other (non HMO) | Attending: Pulmonary Disease

## 2012-01-01 ENCOUNTER — Telehealth: Payer: Self-pay | Admitting: Pulmonary Disease

## 2012-01-01 DIAGNOSIS — R0600 Dyspnea, unspecified: Secondary | ICD-10-CM

## 2012-01-01 DIAGNOSIS — R0609 Other forms of dyspnea: Secondary | ICD-10-CM | POA: Insufficient documentation

## 2012-01-01 DIAGNOSIS — R0989 Other specified symptoms and signs involving the circulatory and respiratory systems: Secondary | ICD-10-CM | POA: Insufficient documentation

## 2012-01-05 ENCOUNTER — Telehealth: Payer: Self-pay | Admitting: Pulmonary Disease

## 2012-01-05 NOTE — Telephone Encounter (Signed)
Pt is aware and will come by to pick up the forms. He would like to know if [facility] has seen the results for the stress test. KC, pls advise.

## 2012-01-05 NOTE — Telephone Encounter (Signed)
Left message with wife to have patient call us back in morning to go over in detail this message as he was not there at this time.

## 2012-01-05 NOTE — Telephone Encounter (Signed)
Pt returned call.  Christopher Newman ° °

## 2012-01-05 NOTE — Telephone Encounter (Signed)
Spoke with pt and made aware that the results of stress test are still pending. He verbalized understanding and states nothing further needed.

## 2012-01-05 NOTE — Telephone Encounter (Signed)
I have already talked with him about this.  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   I will send Dr. Benedetto Goad a note once I see him back and summarize all of the data into one note. He can tell her that I do not think asthma has anything to do with his shortness of breath, and the scarring in the left lung can contribute to his breathing issue, but is more scarring with nothing that is going to change.  We have done a recent exercise test to pinpoint why he is sob, and waiting on results.

## 2012-01-05 NOTE — Telephone Encounter (Signed)
Christopher Newman, has this been taken care of? Thanks.

## 2012-01-05 NOTE — Telephone Encounter (Signed)
Per Dr. Shelle Iron, we do not do the testing that the patient is needing. He will either need to take these forms to his PCP or have them refer him to occupational health.  LMOMTCB x 1

## 2012-01-05 NOTE — Telephone Encounter (Signed)
Please let pt know the final results are still pending.

## 2012-01-05 NOTE — Telephone Encounter (Signed)
Pt states that he needs KC to advise the results of the most recent CT Chest in 12-2011 and of the disc he brought with CT on it as well. Pt says he knows his PCP is going to ask about the results and needs to know what to tell him. KC please advise.

## 2012-01-06 ENCOUNTER — Telehealth: Payer: Self-pay | Admitting: Pulmonary Disease

## 2012-01-06 NOTE — Telephone Encounter (Signed)
Called spoke with patient, advised of KC's recs as documented below.  Pt verbalized his understanding and requested the [facility] be made aware that Dr Ova Freshwater has left [facility] and pt will now be following up with Dr Dina Rich at the same practice.  Will be seeing him today.  Pt also aware that we will call him when his stress test results are available in approx 2 weeks.  Will sign off and forward to [facility] to make him aware of the change in pt's PCP.  Pt's chart updated.

## 2012-01-06 NOTE — Telephone Encounter (Signed)
Spoke with pt. He states needs last CT chest and also ov note faxed to Dr Sol Passer since he will be seeing him soon. Records were faxed. Nothing further needed per pt.

## 2012-01-06 NOTE — Telephone Encounter (Signed)
Returning call can be reached at [phone].Christopher Newman

## 2012-01-06 NOTE — Telephone Encounter (Signed)
LMTCB

## 2012-01-09 ENCOUNTER — Telehealth: Payer: Self-pay | Admitting: Pulmonary Disease

## 2012-01-09 NOTE — Telephone Encounter (Signed)
Noted and will forward to [facility] so that she is aware.

## 2012-01-12 NOTE — Telephone Encounter (Signed)
I called and spoke with Elease Hashimoto in [place] and provided her with the new fax number. She states she has already faxed this pt paperwork this AM and advised the pt. She did not have the new fax number, so I provided this to her and She will refax forms to this number.Carron Curie, CMA

## 2012-01-13 DIAGNOSIS — R0602 Shortness of breath: Secondary | ICD-10-CM

## 2012-01-14 ENCOUNTER — Telehealth: Payer: Self-pay | Admitting: Pulmonary Disease

## 2012-01-14 NOTE — Telephone Encounter (Signed)
See phone note 8/19.  I have already discussed his ct with him.  I have also sent a phone note to Lori's box to get him in to discuss his recent CPST.

## 2012-01-14 NOTE — Telephone Encounter (Signed)
Spoke with pt and notified of recs per [facility] He will keep ov tomorrow to discuss CPST

## 2012-01-14 NOTE — Telephone Encounter (Signed)
KC, please advise on CT chest results; pt would like to have these today. Thanks.

## 2012-01-14 NOTE — Telephone Encounter (Signed)
Christopher Share, MD 01/05/2012 5:14 PM Signed  I have already talked with him about this. 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  I will send Dr. Benedetto Goad a note once I see him back and summarize all of the data into one note.  He can tell her that I do not think asthma has anything to do with his shortness of breath, and the scarring in the left lung can contribute to his breathing issue, but is more scarring with nothing that is going to change. We have done a recent exercise test to pinpoint why he is sob, and waiting on results   [facility] and also made appt for 01-15-12 Thursday at 1130am to discuss CPST.

## 2012-01-15 ENCOUNTER — Ambulatory Visit (INDEPENDENT_AMBULATORY_CARE_PROVIDER_SITE_OTHER): Payer: Managed Care, Other (non HMO) | Admitting: Pulmonary Disease

## 2012-01-15 ENCOUNTER — Encounter: Payer: Self-pay | Admitting: Pulmonary Disease

## 2012-01-15 VITALS — BP 128/80 | HR 72 | Temp 97.4°F | Ht 68.0 in | Wt 226.6 lb

## 2012-01-15 DIAGNOSIS — R918 Other nonspecific abnormal finding of lung field: Secondary | ICD-10-CM

## 2012-01-15 DIAGNOSIS — R0609 Other forms of dyspnea: Secondary | ICD-10-CM

## 2012-01-15 DIAGNOSIS — J45909 Unspecified asthma, uncomplicated: Secondary | ICD-10-CM

## 2012-01-15 DIAGNOSIS — R0600 Dyspnea, unspecified: Secondary | ICD-10-CM

## 2012-01-15 NOTE — Assessment & Plan Note (Addendum)
The patient was found to have only a low normal functional capacity compared to match sedentary norms.  There is mild decrease is felt to represent a ventilatory limitation, and most likely secondary to his obesity or possibly his known interstitial process involving the left lung.  It clearly does not explain the degree of dyspnea the patient is describing, and it is hard to match his exercise study to his subjective dyspnea with any activity.  I have discussed with him a conservative path of weight loss and conditioning, but this is a long-term process.  He does not feel that he can work, but I am unable to find a specific medical reason that can explain this.  I have also outlined a more aggressive path, where we can do bronchoscopy to evaluate his left lung process, and consider a cardiac catheterization.  I would start with the bronchoscopy first, although he understands this may simply be chronic changes that are not related to any type of ongoing process.  We would be trying to determine if he has an inflammatory lung issue that could be progressive.  However, I would find that very unusual to be involving just one lung.  I have outlined the risk and benefits of bronchoscopy, and the patient agrees to proceed.  He will need to stop his anticoagulation one week before the procedure.  Time spent with the patient today was 32 minutes reviewing the above information.

## 2012-01-15 NOTE — Progress Notes (Signed)
  Subjective:    Patient ID: Christopher Newman, male    DOB: 07/27/1950, 61 y.o.   MRN: [id number]  HPI The patient comes in today for followup after his recent cardiopulmonary exercise test.  This was done as part of a workup for dyspnea of unknown origin.  The patient had spirometry prior to the procedure, and also after exercise, and showed no airflow obstruction.  He did not have any episodes of atrial fibrillation or an accelerated ventricular response during the study.  He had no ischemic changes noted.  He did have a few ectopics, but they were not significant.  He was felt to have a low normal functional capacity when compared to matched sedentary norms.  He was felt to have only a mild ventilatory limitation, probably related to his obesity, but may be partially explained by his known interstitial changes on the left lung by CT scan.  He was not felt to have a cardiac limitation.  I have reviewed the study with he and his wife in great detail, and answered all of their questions.   Review of Systems  Constitutional: Negative for fever and unexpected weight change.  HENT: Positive for congestion. Negative for ear pain, nosebleeds, sore throat, rhinorrhea, sneezing, trouble swallowing, dental problem, postnasal drip and sinus pressure.   Eyes: Positive for itching. Negative for redness.  Respiratory: Positive for cough, chest tightness, shortness of breath and wheezing.   Cardiovascular: Positive for palpitations and leg swelling.  Gastrointestinal: Negative for nausea and vomiting.  Genitourinary: Negative for dysuria.  Musculoskeletal: Negative for joint swelling.  Skin: Negative for rash.  Neurological: Negative for headaches.  Hematological: Does not bruise/bleed easily.  Psychiatric/Behavioral: Negative for dysphoric mood. The patient is not nervous/anxious.        Objective:   Physical Exam Obese male in no acute distress Nose without purulence or discharge noted Chest clear, no  wheezing Cardiac exam with regular rate and rhythm Lower extremities without edema, no cyanosis Alert and oriented, moves all 4 extremities.       Assessment & Plan:   

## 2012-01-15 NOTE — Patient Instructions (Addendum)
Will schedule for bronchoscopy next week to evaluate your scarring in the left lung Stop xarelto starting today, and do not take until after your procedure.

## 2012-01-15 NOTE — Assessment & Plan Note (Signed)
The pt had no obstruction on recent spirometry either before or after exercise.  I do not think this is playing a role in anyway with his doe.

## 2012-01-21 ENCOUNTER — Encounter (HOSPITAL_COMMUNITY): Payer: Self-pay

## 2012-01-22 ENCOUNTER — Ambulatory Visit (HOSPITAL_COMMUNITY)
Admission: RE | Admit: 2012-01-22 | Discharge: 2012-01-22 | Disposition: A | Discharge status: Home / Self Care | Payer: Managed Care, Other (non HMO) | Source: Ambulatory Visit | Attending: Pulmonary Disease | Admitting: Pulmonary Disease

## 2012-01-22 ENCOUNTER — Encounter (HOSPITAL_COMMUNITY)
Admission: RE | Disposition: A | Discharge status: Home / Self Care | Payer: Self-pay | Source: Ambulatory Visit | Attending: Pulmonary Disease

## 2012-01-22 ENCOUNTER — Ambulatory Visit (HOSPITAL_COMMUNITY): Payer: Managed Care, Other (non HMO)

## 2012-01-22 ENCOUNTER — Encounter (HOSPITAL_COMMUNITY): Payer: Self-pay | Admitting: Radiology

## 2012-01-22 DIAGNOSIS — R918 Other nonspecific abnormal finding of lung field: Secondary | ICD-10-CM

## 2012-01-22 DIAGNOSIS — R0609 Other forms of dyspnea: Secondary | ICD-10-CM

## 2012-01-22 DIAGNOSIS — R0989 Other specified symptoms and signs involving the circulatory and respiratory systems: Secondary | ICD-10-CM

## 2012-01-22 DIAGNOSIS — B370 Candidal stomatitis: Secondary | ICD-10-CM | POA: Insufficient documentation

## 2012-01-22 HISTORY — PX: VIDEO BRONCHOSCOPY: SHX5072

## 2012-01-22 LAB — BODY FLUID CELL COUNT WITH DIFFERENTIAL
Lymphs, Fluid: 50 %
Monocyte-Macrophage-Serous Fluid: 37 % — ABNORMAL LOW (ref 50–90)

## 2012-01-22 SURGERY — BRONCHOSCOPY, WITH FLUOROSCOPY
Anesthesia: Moderate Sedation | Laterality: Bilateral

## 2012-01-22 MED ORDER — PHENYLEPHRINE HCL 0.25 % NA SOLN
NASAL | Status: DC | PRN
  Administered 2012-01-22: 2 via NASAL

## 2012-01-22 MED ORDER — SODIUM CHLORIDE 0.9 % IV BOLUS (SEPSIS)
500.0000 mL | Freq: Once | INTRAVENOUS | Status: AC
  Administered 2012-01-22: 500 mL via INTRAVENOUS

## 2012-01-22 MED ORDER — LIDOCAINE HCL 1 % IJ SOLN
INTRAMUSCULAR | Status: DC | PRN
  Administered 2012-01-22: 6 mL via RESPIRATORY_TRACT

## 2012-01-22 MED ORDER — MEPERIDINE HCL 100 MG/ML IJ SOLN
INTRAMUSCULAR | Status: AC
  Filled 2012-01-22: qty 2

## 2012-01-22 MED ORDER — MEPERIDINE HCL 25 MG/ML IJ SOLN
INTRAMUSCULAR | Status: DC | PRN
  Administered 2012-01-22: 50 mg via INTRAVENOUS

## 2012-01-22 MED ORDER — LIDOCAINE HCL 2 % EX GEL
CUTANEOUS | Status: DC | PRN
  Administered 2012-01-22: 1

## 2012-01-22 MED ORDER — MIDAZOLAM HCL 10 MG/2ML IJ SOLN
INTRAMUSCULAR | Status: AC
  Filled 2012-01-22: qty 4

## 2012-01-22 MED ORDER — MIDAZOLAM HCL 10 MG/2ML IJ SOLN
INTRAMUSCULAR | Status: DC | PRN
  Administered 2012-01-22: 5 mg via INTRAVENOUS
  Administered 2012-01-22: 2.5 mg via INTRAVENOUS

## 2012-01-22 NOTE — Interval H&P Note (Signed)
History and Physical Interval Note:  01/22/2012 7:39 AM  Christopher Newman  has presented today for surgery, with the diagnosis of Pulmonary Infiltrates  The various methods of treatment have been discussed with the patient and family. After consideration of risks, benefits and other options for treatment, the patient has consented to  Procedure(s) (LRB) with comments: VIDEO BRONCHOSCOPY WITH FLUORO (Bilateral) as a surgical intervention .  The patient's history has been reviewed, patient examined, no change in status, stable for surgery.  I have reviewed the patient's chart and labs.  Questions were answered to the patient's satisfaction.     [name]

## 2012-01-22 NOTE — Op Note (Signed)
Dictation # [id number]

## 2012-01-22 NOTE — Progress Notes (Signed)
MD called regarding blood pressure.  MD  Ordered a 500 ml bolus to be given.    1610- MD called back to update BP results.  BP 90/58, pt is awake, and talking, denies SOB, no distress noted.  Sats 98% on room air.    Dr Shelle Iron states BP is acceptable.

## 2012-01-22 NOTE — Progress Notes (Signed)
Bronchoscopy w/ video intervention performed, with bronchial washing intervention, bronchial biopsy intervention performed, and bronchial brushing intervention performed.

## 2012-01-22 NOTE — H&P (View-Only) (Signed)
  Subjective:    Patient ID: Christopher Newman, male    DOB: [birth year], 61 y.o.   MRN: [id number]  HPI The patient comes in today for followup after his recent cardiopulmonary exercise test.  This was done as part of a workup for dyspnea of unknown origin.  The patient had spirometry prior to the procedure, and also after exercise, and showed no airflow obstruction.  He did not have any episodes of atrial fibrillation or an accelerated ventricular response during the study.  He had no ischemic changes noted.  He did have a few ectopics, but they were not significant.  He was felt to have a low normal functional capacity when compared to matched sedentary norms.  He was felt to have only a mild ventilatory limitation, probably related to his obesity, but may be partially explained by his known interstitial changes on the left lung by CT scan.  He was not felt to have a cardiac limitation.  I have reviewed the study with he and his wife in great detail, and answered all of their questions.   Review of Systems  Constitutional: Negative for fever and unexpected weight change.  HENT: Positive for congestion. Negative for ear pain, nosebleeds, sore throat, rhinorrhea, sneezing, trouble swallowing, dental problem, postnasal drip and sinus pressure.   Eyes: Positive for itching. Negative for redness.  Respiratory: Positive for cough, chest tightness, shortness of breath and wheezing.   Cardiovascular: Positive for palpitations and leg swelling.  Gastrointestinal: Negative for nausea and vomiting.  Genitourinary: Negative for dysuria.  Musculoskeletal: Negative for joint swelling.  Skin: Negative for rash.  Neurological: Negative for headaches.  Hematological: Does not bruise/bleed easily.  Psychiatric/Behavioral: Negative for dysphoric mood. The patient is not nervous/anxious.        Objective:   Physical Exam Obese male in no acute distress Nose without purulence or discharge noted Chest clear, no  wheezing Cardiac exam with regular rate and rhythm Lower extremities without edema, no cyanosis Alert and oriented, moves all 4 extremities.       Assessment & Plan:

## 2012-01-23 ENCOUNTER — Encounter (HOSPITAL_COMMUNITY): Payer: Self-pay | Admitting: Pulmonary Disease

## 2012-01-23 ENCOUNTER — Ambulatory Visit: Payer: Managed Care, Other (non HMO) | Admitting: Pulmonary Disease

## 2012-01-23 LAB — AFB STAIN: Acid Fast Smear: NONE SEEN

## 2012-01-23 NOTE — Op Note (Signed)
NAMEELOISE, MULA NO.:  [id number]  MEDICAL RECORD NO.:  [id number]  LOCATION:                               FACILITY:  [facility]  PHYSICIAN:  [name], MD,FCCPDATE OF BIRTH:  15-Apr-1951  DATE OF PROCEDURE:  01/22/2012 DATE OF DISCHARGE:                              OPERATIVE REPORT   PROCEDURE:  Flexible fiberoptic bronchoscopy with video.  INDICATION:  Left lung infiltrates of unknown origin with dyspnea on exertion.  OPERATORS:  [name], MD,FCCP.  ANESTHESIA:  Versed 7.5 mg IV, Demerol 50 mg IV, topical 1% lidocaine in the vocal cords and airways during the procedure.  DESCRIPTION:  After obtaining informed consent and under close cardiopulmonary monitoring, the above preop anesthesia was given.  The fiberoptic scope was passed to the right naris and into posterior pharynx but there was no significant abnormalities seen.  There did appear to be some mild thrush, but the vocal cords appeared to be within normal limits and moved bilaterally on phonation.  The scope was then passed into the trachea where it was examined along its entire length down to the level of the carina, all of which was normal.  The left or right tracheobronchial trees were examined serially to the subsegmental level, and the right side appeared to be normal.  There were mild edematous changes in the left tracheobronchial tree but no endobronchial process was noted.  Attention was then paid to the left lower lobe bronchus where bronchoalveolar lavage as well as bronchial brushes were taken from basilar segments and sent for the appropriate cytologic and bacteriological evaluation.  Under fluoroscopic guidance, transbronchial biopsies were also done of the various basilar segments of the left lower lobe as well.  The patient maintained good hemostasis throughout the procedure, as well as stable vital signs and oxygenation.  Chest x- ray is pending at the time of  dictation to rule out pneumothorax post transbronchial biopsy.  No immediate complications were noted.     [name], MD,FCCP     [facility]  D:  01/22/2012  T:  01/23/2012  Job:  [id number]

## 2012-01-25 LAB — CULTURE, BAL-QUANTITATIVE W GRAM STAIN
Colony Count: >=100000
Special Requests: NORMAL

## 2012-01-30 ENCOUNTER — Telehealth: Payer: Self-pay | Admitting: Pulmonary Disease

## 2012-01-30 NOTE — Telephone Encounter (Signed)
Spoke with pt. He states that the [employer] place mailed [place] a form on pt requesting records and once this is received he wants it handled asap. Will forward to [place] to keep an eye out for thanks

## 2012-02-04 NOTE — Telephone Encounter (Signed)
Dr. Shelle Iron, have you received any disability forms in the mail for this pt? Pls advise.

## 2012-02-04 NOTE — Telephone Encounter (Signed)
Have not seen paper work?

## 2012-02-06 NOTE — Telephone Encounter (Signed)
Spoke with patient; aware that do not have the forms-pt states that he has a copy at home and will come by the office to drop off with [employer].

## 2012-02-06 NOTE — Telephone Encounter (Signed)
I have not seen any forms. The pt will need to have the company resend them to Dr. Shelle Iron. LMOMTCB x 1 for the pt.

## 2012-02-17 ENCOUNTER — Ambulatory Visit: Payer: Managed Care, Other (non HMO) | Admitting: Pulmonary Disease

## 2012-02-17 LAB — FUNGUS CULTURE W SMEAR

## 2012-02-27 ENCOUNTER — Ambulatory Visit (INDEPENDENT_AMBULATORY_CARE_PROVIDER_SITE_OTHER): Payer: Managed Care, Other (non HMO) | Admitting: Pulmonary Disease

## 2012-02-27 ENCOUNTER — Encounter: Payer: Self-pay | Admitting: Pulmonary Disease

## 2012-02-27 VITALS — BP 112/78 | HR 76 | Temp 97.8°F | Ht 68.0 in | Wt 231.4 lb

## 2012-02-27 DIAGNOSIS — R0600 Dyspnea, unspecified: Secondary | ICD-10-CM

## 2012-02-27 DIAGNOSIS — J45909 Unspecified asthma, uncomplicated: Secondary | ICD-10-CM

## 2012-02-27 DIAGNOSIS — R0989 Other specified symptoms and signs involving the circulatory and respiratory systems: Secondary | ICD-10-CM

## 2012-02-27 NOTE — Assessment & Plan Note (Signed)
Multifactorial, and related to obesity/deconditioning, ?cardiac issues, mild fibrosis left lung.  I have found nothing else from a pulmonary standpoint.  He is going to return to his cardiologist to discuss further, but I do not think a f/u cath would be unreasonable.

## 2012-02-27 NOTE — Patient Instructions (Addendum)
Stay on current inhaler regimen. Please get back with your cardiologist to discuss further testing. followup with me in 6mos, but call if something changes.

## 2012-02-27 NOTE — Progress Notes (Signed)
  Subjective:    Patient ID: Christopher Newman, male    DOB: 06-13-1950, 61 y.o.   MRN: [id number]  HPI The pt comes in today for f/u of his known asthma, and multifactorial doe.  His dyspnea is at baseline, and he denies any wheezing during the day which requires rescue inhaler.  He has an am cough, with clear mucus from his throat.  He describes classic upper airway pseudowheezing with this until it clears.  No rescue inhaler use since last visit.    Review of Systems  Constitutional: Negative for fever and unexpected weight change.  HENT: Negative for ear pain, nosebleeds, congestion, sore throat, rhinorrhea, sneezing, trouble swallowing, dental problem, postnasal drip and sinus pressure.   Eyes: Negative for redness and itching.  Respiratory: Positive for cough, chest tightness, shortness of breath and wheezing.   Cardiovascular: Negative for palpitations and leg swelling.  Gastrointestinal: Negative for nausea and vomiting.  Genitourinary: Negative for dysuria.  Musculoskeletal: Negative for joint swelling.  Skin: Negative for rash.  Neurological: Negative for headaches.  Hematological: Does not bruise/bleed easily.  Psychiatric/Behavioral: Negative for dysphoric mood. The patient is not nervous/anxious.        Objective:   Physical Exam Obese male in nad Nose without purulence or discharge noted Chest with mild basilar crackles, no wheezing Cor with rrr LE without significant edema Alert and oriented, moves all 4.        Assessment & Plan:

## 2012-02-27 NOTE — Assessment & Plan Note (Signed)
Pt is doing well from this standpoint on his current regimen.

## 2012-03-04 ENCOUNTER — Telehealth: Payer: Self-pay | Admitting: Pulmonary Disease

## 2012-03-04 NOTE — Telephone Encounter (Signed)
Called, spoke with Herbert Seta from Dr. Jerrell Mylar office.  States they receive an email from pt stating Dr. Shelle Iron was supposed to email Dr. Rudolpho Sevin.  Dr. Rudolpho Sevin hasn't received anything.  Dr. Shelle Iron, pls advise.  Thank you.

## 2012-03-05 LAB — AFB CULTURE WITH SMEAR (NOT AT ARMC)

## 2012-03-05 NOTE — Telephone Encounter (Signed)
Pt called back to state that he called dr Jerrell Mylar office (moments ago) and was told they have not yet received this info. Dr Roberts Gaudy office ph# [phone]. Hazel Sams

## 2012-03-05 NOTE — Telephone Encounter (Signed)
I sent a copy of my note thru epic to his office.  Please see if they have this, and if not will need to send and then find out why epic didn't send the note.  If they didn't receive, verify epic has the right fax number, and if we do, then let Budd Palmer know about this.

## 2012-03-05 NOTE — Telephone Encounter (Signed)
I have printed last OV note and faxed it to Heather's attn at # given below.  I lmomtcb on heather's ext to inform her of this and see if she receives it.  I have also printed phone msg to show Budd Palmer as we have tried to fax OV note twice via epic.

## 2012-03-05 NOTE — Telephone Encounter (Signed)
Christopher Newman returning call says she hasn't received office note, wants you to refax at this number: [phone].Christopher Newman

## 2012-03-05 NOTE — Telephone Encounter (Signed)
I refaxed OV note via epic after verifying fax number. I LMTCBx1 with heather to see if she received this. Carron Curie, CMA

## 2012-03-09 NOTE — Telephone Encounter (Signed)
Herbert Seta out of the office today.  Will confirm when she returns.

## 2012-03-09 NOTE — Telephone Encounter (Signed)
LMtcb for heather to confirm she did receive fax

## 2012-03-10 NOTE — Telephone Encounter (Signed)
Heather returned call & confirmed that she received the fax.  Herbert Seta stated nothing further needed at this time.  Antionette Fairy

## 2012-07-10 ENCOUNTER — Other Ambulatory Visit: Payer: Self-pay | Admitting: Pulmonary Disease

## 2012-07-13 ENCOUNTER — Telehealth: Payer: Self-pay | Admitting: Pulmonary Disease

## 2012-07-13 MED ORDER — MOMETASONE FURO-FORMOTEROL FUM 100-5 MCG/ACT IN AERO: 2.0000 | INHALATION_SPRAY | Freq: Two times a day (BID) | RESPIRATORY_TRACT | Status: DC

## 2012-07-13 NOTE — Telephone Encounter (Signed)
Rx for dulera was refilled LMOM for the pt to be made aware

## 2012-08-09 ENCOUNTER — Other Ambulatory Visit: Payer: Self-pay | Admitting: Pulmonary Disease

## 2012-08-23 ENCOUNTER — Other Ambulatory Visit: Payer: Self-pay | Admitting: Pulmonary Disease

## 2012-08-23 MED ORDER — MOMETASONE FURO-FORMOTEROL FUM 100-5 MCG/ACT IN AERO: 2.0000 | INHALATION_SPRAY | Freq: Two times a day (BID) | RESPIRATORY_TRACT | Status: DC

## 2012-08-23 NOTE — Telephone Encounter (Signed)
[facility] REQUESTING RX  DULERA 100/5 INHALE 2 PUFFS BID #2 RX SENT PT HAS OV SCHEDULES 08-27-12

## 2012-08-27 ENCOUNTER — Encounter: Payer: Self-pay | Admitting: Pulmonary Disease

## 2012-08-27 ENCOUNTER — Ambulatory Visit (INDEPENDENT_AMBULATORY_CARE_PROVIDER_SITE_OTHER): Payer: Managed Care, Other (non HMO) | Admitting: Pulmonary Disease

## 2012-08-27 VITALS — BP 100/66 | HR 63 | Temp 97.0°F | Ht 68.5 in | Wt 229.0 lb

## 2012-08-27 DIAGNOSIS — R0989 Other specified symptoms and signs involving the circulatory and respiratory systems: Secondary | ICD-10-CM

## 2012-08-27 DIAGNOSIS — J45909 Unspecified asthma, uncomplicated: Secondary | ICD-10-CM

## 2012-08-27 DIAGNOSIS — R0600 Dyspnea, unspecified: Secondary | ICD-10-CM

## 2012-08-27 NOTE — Assessment & Plan Note (Signed)
The patient's asthma is currently under very good control, although he did have a recent episode of acute bronchitis.  He feels that he is back to his baseline from that standpoint.  I have asked him to continue on his current regimen, and to followup with me in 6 months.

## 2012-08-27 NOTE — Assessment & Plan Note (Signed)
The patient has had multifactorial dyspnea on exertion, but we have never found the main reason for this.  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  I suspect this is a major reason for his breathing issues, but he also needs to work on weight loss and conditioning.

## 2012-08-27 NOTE — Patient Instructions (Addendum)
Stay on your asthma medications Work on weight loss and conditioning. Discuss with your cardiologist being referred to cardiac rehab program near your home. followup with me in 6mos .

## 2012-08-27 NOTE — Progress Notes (Signed)
  Subjective:    Patient ID: Christopher Newman, male    DOB: 1950/11/26, 62 y.o.   MRN: [id number]  HPI Patient comes in today for followup of his known asthma.  He has been doing well except for recent episode of bronchitis, but responded to treatment with an antibiotic.  He has also had chronic dyspnea on exertion, and has been found to have pulmonary vein stenosis by his cardiologist, probably related to his prior ablation.  The patient has been staying compliant on his inhaler regimen.   Review of Systems  Constitutional: Negative for fever and unexpected weight change.  HENT: Negative for ear pain, nosebleeds, congestion, sore throat, rhinorrhea, sneezing, trouble swallowing, dental problem, postnasal drip and sinus pressure.   Eyes: Negative for redness and itching.  Respiratory: Positive for cough, shortness of breath and wheezing. Negative for chest tightness.        Pt recently treated with Cipro for bronchitis  Cardiovascular: Positive for palpitations. Negative for leg swelling.  Gastrointestinal: Negative for nausea and vomiting.  Genitourinary: Negative for dysuria.  Musculoskeletal: Negative for joint swelling.  Skin: Negative for rash.  Neurological: Negative for headaches.  Hematological: Does not bruise/bleed easily.  Psychiatric/Behavioral: Negative for dysphoric mood. The patient is not nervous/anxious.        Objective:   Physical Exam Obese male in no acute distress Nose without purulence or discharge noted Neck without lymphadenopathy or thyromegaly Chest totally clear to auscultation, no wheezes Cardiac exam with regular rate and rhythm Lower extremities with minimal edema, cyanosis Alert and oriented, moves all 4 extremities.       Assessment & Plan:

## 2012-11-12 ENCOUNTER — Other Ambulatory Visit: Payer: Self-pay | Admitting: Pulmonary Disease

## 2012-11-12 MED ORDER — MOMETASONE FURO-FORMOTEROL FUM 100-5 MCG/ACT IN AERO: 2.0000 | INHALATION_SPRAY | Freq: Two times a day (BID) | RESPIRATORY_TRACT | Status: DC

## 2012-12-11 ENCOUNTER — Other Ambulatory Visit: Payer: Self-pay | Admitting: Pulmonary Disease

## 2013-01-03 ENCOUNTER — Telehealth: Payer: Self-pay | Admitting: Pulmonary Disease

## 2013-01-03 NOTE — Telephone Encounter (Signed)
Routing to KC per his request. 

## 2013-01-04 NOTE — Telephone Encounter (Signed)
I spoke with pt and made him aware. He voiced her understanding and also her cards is getting one as well. Nothing further needed

## 2013-01-04 NOTE — Telephone Encounter (Signed)
Pt called back again re: same. Kathleen W Perdue  °

## 2013-01-04 NOTE — Telephone Encounter (Signed)
I am happy to fill out papers from a pulmonary perspective, but that is really not his issue causing his breathing problem.  The primary issue is cardiovascular, and needs to be addressed by his cardiologist.  There is no testing to support disability from a breathing standpoint since the major issue is with his vascular stenosis.  I will be happy to fill out what I can.

## 2013-03-01 ENCOUNTER — Ambulatory Visit: Payer: Managed Care, Other (non HMO) | Admitting: Pulmonary Disease

## 2013-04-01 ENCOUNTER — Ambulatory Visit: Payer: Managed Care, Other (non HMO) | Admitting: Pulmonary Disease

## 2013-04-13 ENCOUNTER — Other Ambulatory Visit: Payer: Self-pay | Admitting: Pulmonary Disease

## 2013-04-14 ENCOUNTER — Other Ambulatory Visit: Payer: Self-pay | Admitting: Pulmonary Disease

## 2013-04-14 MED ORDER — MOMETASONE FURO-FORMOTEROL FUM 100-5 MCG/ACT IN AERO: INHALATION_SPRAY | RESPIRATORY_TRACT | Status: DC

## 2013-04-14 NOTE — Telephone Encounter (Signed)
Requesting refill on dulera 100, already done 04/13/13 electronically  Repeated refill on phone recorder at [facility]  Has f/u appt for Dec 17, strongly rec he keep it and call in meantime if symptoms flare  Chart review :  Note also on Betapace so asthma flares or difficult to control or truly Beta agonist dep may need to consider d/c beta antagonist.

## 2013-05-04 ENCOUNTER — Encounter: Payer: Self-pay | Admitting: Pulmonary Disease

## 2013-05-04 ENCOUNTER — Ambulatory Visit (INDEPENDENT_AMBULATORY_CARE_PROVIDER_SITE_OTHER): Payer: 59 | Admitting: Pulmonary Disease

## 2013-05-04 VITALS — BP 116/76 | HR 58 | Temp 97.7°F | Ht 68.0 in | Wt 233.0 lb

## 2013-05-04 DIAGNOSIS — R0600 Dyspnea, unspecified: Secondary | ICD-10-CM

## 2013-05-04 DIAGNOSIS — I288 Other diseases of pulmonary vessels: Secondary | ICD-10-CM | POA: Insufficient documentation

## 2013-05-04 DIAGNOSIS — J45909 Unspecified asthma, uncomplicated: Secondary | ICD-10-CM

## 2013-05-04 DIAGNOSIS — R0609 Other forms of dyspnea: Secondary | ICD-10-CM

## 2013-05-04 DIAGNOSIS — Q268 Other congenital malformations of great veins: Secondary | ICD-10-CM

## 2013-05-04 MED ORDER — MOMETASONE FURO-FORMOTEROL FUM 100-5 MCG/ACT IN AERO: INHALATION_SPRAY | RESPIRATORY_TRACT | Status: DC

## 2013-05-04 MED ORDER — ALBUTEROL SULFATE HFA 108 (90 BASE) MCG/ACT IN AERS: INHALATION_SPRAY | RESPIRATORY_TRACT | Status: AC

## 2013-05-04 NOTE — Progress Notes (Signed)
   Subjective:    Patient ID: Christopher Newman, male    DOB: Feb 16, 1951, 62 y.o.   MRN: [id number]  HPI The patient comes in today for followup of his known asthma, and also dyspnea on exertion felt secondary to pulmonary vein stenosis.  He has been stable from an asthma standpoint on his maintenance regimen, and rarely requires his rescue inhaler. His dyspnea on exertion is at baseline, and he continues to follow closely with his cardiologist. He denies any significant cough or congestion, and has had his flu shot this year.   Review of Systems  Constitutional: Negative for fever and unexpected weight change.  HENT: Negative for congestion, dental problem, ear pain, nosebleeds, postnasal drip, rhinorrhea, sinus pressure, sneezing, sore throat and trouble swallowing.   Eyes: Negative for redness and itching.  Respiratory: Positive for shortness of breath. Negative for cough, chest tightness and wheezing.   Cardiovascular: Negative for palpitations and leg swelling.  Gastrointestinal: Negative for nausea and vomiting.  Genitourinary: Negative for dysuria.  Musculoskeletal: Negative for joint swelling.  Skin: Negative for rash.  Neurological: Negative for headaches.  Hematological: Does not bruise/bleed easily.  Psychiatric/Behavioral: Negative for dysphoric mood. The patient is not nervous/anxious.        Objective:   Physical Exam Obese male in nad Nose without purulence or discharge noted Neck without LN or TMG Chest totally clear to auscultation Cor with rrr LE with mild edema, no cyanosis Alert and oriented, moves all 4.        Assessment & Plan:

## 2013-05-04 NOTE — Assessment & Plan Note (Signed)
The patient is completely stable from an asthma standpoint, and has not had any acute exacerbation since his last visit. I've asked him to continue on his maintenance inhaler regimen.

## 2013-05-04 NOTE — Patient Instructions (Signed)
Will send prescriptions for your dulera and albuterol, with a year supply. Work on [employer] loss and conditioning program.  followup with me in 6mos.

## 2013-05-04 NOTE — Assessment & Plan Note (Signed)
Remains at stable baseline.  I have stressed to him again the importance of aggressive weight loss.

## 2013-11-02 ENCOUNTER — Ambulatory Visit (INDEPENDENT_AMBULATORY_CARE_PROVIDER_SITE_OTHER): Payer: 59 | Admitting: Pulmonary Disease

## 2013-11-02 ENCOUNTER — Encounter: Payer: Self-pay | Admitting: Pulmonary Disease

## 2013-11-02 VITALS — BP 130/82 | HR 58 | Temp 98.1°F | Ht 68.0 in | Wt 233.2 lb

## 2013-11-02 DIAGNOSIS — R0600 Dyspnea, unspecified: Secondary | ICD-10-CM

## 2013-11-02 DIAGNOSIS — Q268 Other congenital malformations of great veins: Secondary | ICD-10-CM

## 2013-11-02 DIAGNOSIS — J45909 Unspecified asthma, uncomplicated: Secondary | ICD-10-CM

## 2013-11-02 DIAGNOSIS — R0989 Other specified symptoms and signs involving the circulatory and respiratory systems: Secondary | ICD-10-CM

## 2013-11-02 DIAGNOSIS — R0609 Other forms of dyspnea: Secondary | ICD-10-CM

## 2013-11-02 NOTE — Patient Instructions (Signed)
Continue on current asthma medications Work on weight reduction followup with me again in one year if breathing is remaining stable.

## 2013-11-02 NOTE — Assessment & Plan Note (Signed)
The patient is completely stable from an asthma standpoint, with excellent air flow on exam but no bronchospasm. I've asked him to continue on his usual asthma regimen.

## 2013-11-02 NOTE — Progress Notes (Signed)
   Subjective:    Patient ID: Christopher Newman, male    DOB: 03/25/1951, 63 y.o.   MRN: [id number]  HPI Patient comes in today for followup of his mild asthma, as well as chronic dyspnea on exertion secondary to pulmonary vein stenosis and obesity with deconditioning. The patient states that his exertional tolerance is at baseline, and he has had no significant flareups of his asthma. He has minimal cough with clear mucus, no chest congestion, and has not required frequent use of his rescue inhaler. He is continuing to followup with his cardiologist.   Review of Systems  Constitutional: Negative for fever and unexpected weight change.  HENT: Negative for congestion, dental problem, ear pain, nosebleeds, postnasal drip, rhinorrhea, sinus pressure, sneezing, sore throat and trouble swallowing.   Eyes: Negative for redness and itching.  Respiratory: Positive for cough and shortness of breath. Negative for chest tightness and wheezing.   Cardiovascular: Negative for palpitations and leg swelling.  Gastrointestinal: Negative for nausea and vomiting.  Genitourinary: Negative for dysuria.  Musculoskeletal: Negative for joint swelling.  Skin: Negative for rash.  Neurological: Negative for headaches.  Hematological: Does not bruise/bleed easily.  Psychiatric/Behavioral: Negative for dysphoric mood. The patient is not nervous/anxious.        Objective:   Physical Exam Obese male in no acute distress Nose without purulence or discharge noted Neck without lymphadenopathy or thyromegaly Chest totally clear to auscultation, no wheezing Cardiac exam with regular rate and rhythm Lower extremities with mild edema, no cyanosis Alert and oriented, moves all 4 extremities.       Assessment & Plan:

## 2014-05-02 ENCOUNTER — Inpatient Hospital Stay (HOSPITAL_COMMUNITY)
Admission: AD | Admit: 2014-05-02 | Discharge: 2014-05-11 | DRG: 516 | Disposition: A | Discharge status: Home / Self Care | Payer: No Typology Code available for payment source | Source: Other Acute Inpatient Hospital | Attending: Orthopedic Surgery | Admitting: Orthopedic Surgery

## 2014-05-02 ENCOUNTER — Encounter (HOSPITAL_COMMUNITY): Payer: Self-pay | Admitting: *Deleted

## 2014-05-02 DIAGNOSIS — E669 Obesity, unspecified: Secondary | ICD-10-CM | POA: Diagnosis present

## 2014-05-02 DIAGNOSIS — S42009A Fracture of unspecified part of unspecified clavicle, initial encounter for closed fracture: Secondary | ICD-10-CM | POA: Diagnosis present

## 2014-05-02 DIAGNOSIS — I959 Hypotension, unspecified: Secondary | ICD-10-CM | POA: Diagnosis present

## 2014-05-02 DIAGNOSIS — K567 Ileus, unspecified: Secondary | ICD-10-CM | POA: Diagnosis not present

## 2014-05-02 DIAGNOSIS — Z7901 Long term (current) use of anticoagulants: Secondary | ICD-10-CM

## 2014-05-02 DIAGNOSIS — S2249XA Multiple fractures of ribs, unspecified side, initial encounter for closed fracture: Secondary | ICD-10-CM

## 2014-05-02 DIAGNOSIS — S42002A Fracture of unspecified part of left clavicle, initial encounter for closed fracture: Principal | ICD-10-CM | POA: Diagnosis present

## 2014-05-02 DIAGNOSIS — E785 Hyperlipidemia, unspecified: Secondary | ICD-10-CM | POA: Diagnosis present

## 2014-05-02 DIAGNOSIS — G4733 Obstructive sleep apnea (adult) (pediatric): Secondary | ICD-10-CM | POA: Diagnosis present

## 2014-05-02 DIAGNOSIS — Z8249 Family history of ischemic heart disease and other diseases of the circulatory system: Secondary | ICD-10-CM

## 2014-05-02 DIAGNOSIS — N179 Acute kidney failure, unspecified: Secondary | ICD-10-CM | POA: Diagnosis not present

## 2014-05-02 DIAGNOSIS — M549 Dorsalgia, unspecified: Secondary | ICD-10-CM | POA: Diagnosis present

## 2014-05-02 DIAGNOSIS — S2239XD Fracture of one rib, unspecified side, subsequent encounter for fracture with routine healing: Secondary | ICD-10-CM

## 2014-05-02 DIAGNOSIS — Z87891 Personal history of nicotine dependence: Secondary | ICD-10-CM | POA: Diagnosis not present

## 2014-05-02 DIAGNOSIS — I4891 Unspecified atrial fibrillation: Secondary | ICD-10-CM | POA: Diagnosis present

## 2014-05-02 DIAGNOSIS — K560 Paralytic ileus: Secondary | ICD-10-CM

## 2014-05-02 DIAGNOSIS — Z7951 Long term (current) use of inhaled steroids: Secondary | ICD-10-CM | POA: Diagnosis not present

## 2014-05-02 DIAGNOSIS — J45909 Unspecified asthma, uncomplicated: Secondary | ICD-10-CM | POA: Diagnosis present

## 2014-05-02 DIAGNOSIS — I10 Essential (primary) hypertension: Secondary | ICD-10-CM | POA: Diagnosis present

## 2014-05-02 DIAGNOSIS — S2242XA Multiple fractures of ribs, left side, initial encounter for closed fracture: Secondary | ICD-10-CM | POA: Diagnosis present

## 2014-05-02 DIAGNOSIS — Z6835 Body mass index (BMI) 35.0-35.9, adult: Secondary | ICD-10-CM | POA: Diagnosis not present

## 2014-05-02 DIAGNOSIS — E876 Hypokalemia: Secondary | ICD-10-CM | POA: Diagnosis not present

## 2014-05-02 DIAGNOSIS — J942 Hemothorax: Secondary | ICD-10-CM | POA: Diagnosis present

## 2014-05-02 DIAGNOSIS — R0602 Shortness of breath: Secondary | ICD-10-CM

## 2014-05-02 DIAGNOSIS — S2239XA Fracture of one rib, unspecified side, initial encounter for closed fracture: Secondary | ICD-10-CM

## 2014-05-02 DIAGNOSIS — Z419 Encounter for procedure for purposes other than remedying health state, unspecified: Secondary | ICD-10-CM

## 2014-05-02 LAB — GLUCOSE, CAPILLARY: GLUCOSE-CAPILLARY: 138 mg/dL — AB (ref 70–99)

## 2014-05-02 LAB — MRSA PCR SCREENING: MRSA by PCR: NEGATIVE

## 2014-05-02 MED ORDER — OXYCODONE HCL 5 MG PO TABS
2.5000 mg | ORAL_TABLET | ORAL | Status: DC | PRN
  Administered 2014-05-03 (×3): 10 mg via ORAL
  Filled 2014-05-02: qty 2
  Filled 2014-05-02 (×3): qty 1
  Filled 2014-05-02: qty 2

## 2014-05-02 MED ORDER — DOCUSATE SODIUM 100 MG PO CAPS
100.0000 mg | ORAL_CAPSULE | Freq: Two times a day (BID) | ORAL | Status: DC
  Administered 2014-05-02 – 2014-05-10 (×14): 100 mg via ORAL
  Filled 2014-05-02 (×17): qty 1

## 2014-05-02 MED ORDER — ONDANSETRON HCL 4 MG PO TABS
4.0000 mg | ORAL_TABLET | Freq: Four times a day (QID) | ORAL | Status: DC | PRN
  Administered 2014-05-05 (×2): 4 mg via ORAL
  Filled 2014-05-02 (×2): qty 1

## 2014-05-02 MED ORDER — ATORVASTATIN CALCIUM 40 MG PO TABS
40.0000 mg | ORAL_TABLET | Freq: Every day | ORAL | Status: DC
Start: 2014-05-03 — End: 2014-05-06
  Administered 2014-05-03 – 2014-05-04 (×2): 40 mg via ORAL
  Filled 2014-05-02 (×4): qty 1

## 2014-05-02 MED ORDER — CETYLPYRIDINIUM CHLORIDE 0.05 % MT LIQD
7.0000 mL | Freq: Two times a day (BID) | OROMUCOSAL | Status: DC
  Administered 2014-05-02 – 2014-05-11 (×18): 7 mL via OROMUCOSAL

## 2014-05-02 MED ORDER — HYDROMORPHONE HCL 1 MG/ML IJ SOLN
1.0000 mg | INTRAMUSCULAR | Status: DC | PRN
  Administered 2014-05-02 – 2014-05-03 (×4): 1 mg via INTRAVENOUS
  Filled 2014-05-02: qty 2
  Filled 2014-05-02 (×3): qty 1

## 2014-05-02 MED ORDER — DILTIAZEM HCL ER COATED BEADS 120 MG PO CP24
120.0000 mg | ORAL_CAPSULE | Freq: Two times a day (BID) | ORAL | Status: DC
  Administered 2014-05-02 – 2014-05-11 (×15): 120 mg via ORAL
  Filled 2014-05-02 (×21): qty 1

## 2014-05-02 MED ORDER — FINASTERIDE 5 MG PO TABS
5.0000 mg | ORAL_TABLET | Freq: Every day | ORAL | Status: DC
  Administered 2014-05-03 – 2014-05-11 (×8): 5 mg via ORAL
  Filled 2014-05-02 (×9): qty 1

## 2014-05-02 MED ORDER — DIPHENHYDRAMINE HCL 25 MG PO CAPS: 25.0000 mg | ORAL_CAPSULE | Freq: Four times a day (QID) | ORAL | Status: DC | PRN

## 2014-05-02 MED ORDER — SODIUM CHLORIDE 0.9 % IJ SOLN
3.0000 mL | Freq: Two times a day (BID) | INTRAMUSCULAR | Status: DC
  Administered 2014-05-02: 3 mL via INTRAVENOUS
  Administered 2014-05-03: 10:00:00 via INTRAVENOUS
  Administered 2014-05-03 – 2014-05-04 (×3): 3 mL via INTRAVENOUS

## 2014-05-02 MED ORDER — MOMETASONE FURO-FORMOTEROL FUM 100-5 MCG/ACT IN AERO
2.0000 | INHALATION_SPRAY | Freq: Two times a day (BID) | RESPIRATORY_TRACT | Status: DC
  Administered 2014-05-03 – 2014-05-11 (×15): 2 via RESPIRATORY_TRACT
  Filled 2014-05-02 (×3): qty 8.8

## 2014-05-02 MED ORDER — LOSARTAN POTASSIUM 50 MG PO TABS
100.0000 mg | ORAL_TABLET | Freq: Every day | ORAL | Status: DC
  Administered 2014-05-03: 100 mg via ORAL
  Filled 2014-05-02 (×2): qty 2

## 2014-05-02 MED ORDER — POTASSIUM CHLORIDE CRYS ER 20 MEQ PO TBCR
20.0000 meq | EXTENDED_RELEASE_TABLET | Freq: Every day | ORAL | Status: DC
  Administered 2014-05-03 – 2014-05-04 (×2): 20 meq via ORAL
  Filled 2014-05-02 (×2): qty 1

## 2014-05-02 MED ORDER — ALBUTEROL SULFATE (2.5 MG/3ML) 0.083% IN NEBU: 3.0000 mL | INHALATION_SOLUTION | RESPIRATORY_TRACT | Status: DC | PRN

## 2014-05-02 MED ORDER — HYDROCHLOROTHIAZIDE 12.5 MG PO CAPS
12.5000 mg | ORAL_CAPSULE | Freq: Every day | ORAL | Status: DC
  Administered 2014-05-03: 12.5 mg via ORAL
  Filled 2014-05-02 (×2): qty 1

## 2014-05-02 MED ORDER — SODIUM CHLORIDE 0.9 % IV SOLN: 250.0000 mL | INTRAVENOUS | Status: DC | PRN

## 2014-05-02 MED ORDER — LOSARTAN POTASSIUM-HCTZ 100-12.5 MG PO TABS: 1.0000 | ORAL_TABLET | Freq: Every day | ORAL | Status: DC

## 2014-05-02 MED ORDER — ONDANSETRON HCL 4 MG/2ML IJ SOLN: 4.0000 mg | Freq: Four times a day (QID) | INTRAMUSCULAR | Status: DC | PRN

## 2014-05-02 MED ORDER — SODIUM CHLORIDE 0.9 % IJ SOLN: 3.0000 mL | INTRAMUSCULAR | Status: DC | PRN

## 2014-05-02 MED ORDER — SOTALOL HCL 80 MG PO TABS
80.0000 mg | ORAL_TABLET | ORAL | Status: DC
  Administered 2014-05-03 – 2014-05-04 (×2): 80 mg via ORAL
  Filled 2014-05-02 (×7): qty 1

## 2014-05-03 ENCOUNTER — Inpatient Hospital Stay (HOSPITAL_COMMUNITY): Payer: No Typology Code available for payment source

## 2014-05-03 LAB — CBC
HCT: 38.8 % — ABNORMAL LOW (ref 39.0–52.0)
HEMATOCRIT: 40.8 % (ref 39.0–52.0)
Hemoglobin: 13.5 g/dL (ref 13.0–17.0)
Hemoglobin: 12.8 g/dL — ABNORMAL LOW (ref 13.0–17.0)
MCH: 29.1 pg (ref 26.0–34.0)
MCH: 29.2 pg (ref 26.0–34.0)
MCHC: 33.1 g/dL (ref 30.0–36.0)
MCHC: 33 g/dL (ref 30.0–36.0)
MCV: 87.9 fL (ref 78.0–100.0)
MCV: 88.4 fL (ref 78.0–100.0)
Platelets: 202 10*3/uL (ref 150–400)
Platelets: 194 10*3/uL (ref 150–400)
RBC: 4.64 MIL/uL (ref 4.22–5.81)
RBC: 4.39 MIL/uL (ref 4.22–5.81)
RDW: 13.9 % (ref 11.5–15.5)
RDW: 14 % (ref 11.5–15.5)
WBC: 13.7 10*3/uL — AB (ref 4.0–10.5)
WBC: 15.1 10*3/uL — AB (ref 4.0–10.5)

## 2014-05-03 LAB — BASIC METABOLIC PANEL
Anion gap: 14 (ref 5–15)
BUN: 20 mg/dL (ref 6–23)
CHLORIDE: 97 meq/L (ref 96–112)
CO2: 26 meq/L (ref 19–32)
CREATININE: 1.5 mg/dL — AB (ref 0.50–1.35)
Calcium: 9.1 mg/dL (ref 8.4–10.5)
GFR calc non Af Amer: 49 mL/min — ABNORMAL LOW (ref >90)
GFR, EST AFRICAN AMERICAN: 56 mL/min — AB (ref >90)
Glucose, Bld: 133 mg/dL — ABNORMAL HIGH (ref 70–99)
Potassium: 4.6 mEq/L (ref 3.7–5.3)
Sodium: 137 mEq/L (ref 137–147)

## 2014-05-03 LAB — PROTIME-INR
INR: 1.44 (ref 0.00–1.49)
Prothrombin Time: 17.6 seconds — ABNORMAL HIGH (ref 11.6–15.2)

## 2014-05-03 MED ORDER — SODIUM CHLORIDE 0.9 % IV BOLUS (SEPSIS)
500.0000 mL | Freq: Once | INTRAVENOUS | Status: AC
Start: 2014-05-03 — End: 2014-05-03
  Administered 2014-05-03: 500 mL via INTRAVENOUS

## 2014-05-03 NOTE — Progress Notes (Signed)
Pt hypotensive 60-80s.  Pt alert and oriented, but lethargic.  Dr. Lindie SpruceWyatt notified.  500cc NS bolus ordered.  Will monitor pt.
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
## 2014-05-03 NOTE — Progress Notes (Signed)
Pt still hypotensive despite 500cc NS bolus.  Dr. Lindie SpruceWyatt notified.  Orders received.  Will monitor pt.

## 2014-05-03 NOTE — Progress Notes (Signed)
Patient recently seen by my partner.  He is sitting up in a ;chair, no acute distress.  CXR shows some slight improvement.  Hemoglobin is stable.  Needs IS and PT.  Can be transferred to SDU.  Continue to hold Xarelto.  Marta LamasJames O. Gae BonWyatt, III, MD, FACS [phone] Trauma Surgeon
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
## 2014-05-04 ENCOUNTER — Inpatient Hospital Stay (HOSPITAL_COMMUNITY): Payer: No Typology Code available for payment source

## 2014-05-04 LAB — URINALYSIS, ROUTINE W REFLEX MICROSCOPIC
Bilirubin Urine: NEGATIVE
GLUCOSE, UA: NEGATIVE mg/dL
Ketones, ur: 15 mg/dL — AB
Leukocytes, UA: NEGATIVE
Nitrite: NEGATIVE
PROTEIN: NEGATIVE mg/dL
Specific Gravity, Urine: 1.02 (ref 1.005–1.030)
Urobilinogen, UA: 0.2 mg/dL (ref 0.0–1.0)
pH: 5 (ref 5.0–8.0)

## 2014-05-04 LAB — BASIC METABOLIC PANEL WITH GFR
Anion gap: 16 — ABNORMAL HIGH (ref 5–15)
BUN: 42 mg/dL — ABNORMAL HIGH (ref 6–23)
CO2: 23 meq/L (ref 19–32)
Calcium: 8.9 mg/dL (ref 8.4–10.5)
Chloride: 94 meq/L — ABNORMAL LOW (ref 96–112)
Creatinine, Ser: 3.48 mg/dL — ABNORMAL HIGH (ref 0.50–1.35)
GFR calc Af Amer: 20 mL/min — ABNORMAL LOW
GFR calc non Af Amer: 18 mL/min — ABNORMAL LOW
Glucose, Bld: 116 mg/dL — ABNORMAL HIGH (ref 70–99)
Potassium: 4.6 meq/L (ref 3.7–5.3)
Sodium: 133 meq/L — ABNORMAL LOW (ref 137–147)

## 2014-05-04 LAB — CBC WITH DIFFERENTIAL/PLATELET
Basophils Absolute: 0 K/uL (ref 0.0–0.1)
Basophils Absolute: 0 10*3/uL (ref 0.0–0.1)
Basophils Relative: 0 % (ref 0–1)
Basophils Relative: 0 % (ref 0–1)
EOS ABS: 0 10*3/uL (ref 0.0–0.7)
Eosinophils Absolute: 0 K/uL (ref 0.0–0.7)
Eosinophils Relative: 0 % (ref 0–5)
Eosinophils Relative: 0 % (ref 0–5)
HCT: 37.8 % — ABNORMAL LOW (ref 39.0–52.0)
HCT: 37.2 % — ABNORMAL LOW (ref 39.0–52.0)
HEMOGLOBIN: 12.5 g/dL — AB (ref 13.0–17.0)
Hemoglobin: 12.3 g/dL — ABNORMAL LOW (ref 13.0–17.0)
LYMPHS PCT: 7 % — AB (ref 12–46)
Lymphocytes Relative: 10 % — ABNORMAL LOW (ref 12–46)
Lymphs Abs: 1.4 K/uL (ref 0.7–4.0)
Lymphs Abs: 0.9 10*3/uL (ref 0.7–4.0)
MCH: 28.9 pg (ref 26.0–34.0)
MCH: 29.1 pg (ref 26.0–34.0)
MCHC: 32.5 g/dL (ref 30.0–36.0)
MCHC: 33.6 g/dL (ref 30.0–36.0)
MCV: 88.7 fL (ref 78.0–100.0)
MCV: 86.7 fL (ref 78.0–100.0)
Monocytes Absolute: 2.2 K/uL — ABNORMAL HIGH (ref 0.1–1.0)
Monocytes Absolute: 1.5 10*3/uL — ABNORMAL HIGH (ref 0.1–1.0)
Monocytes Relative: 15 % — ABNORMAL HIGH (ref 3–12)
Monocytes Relative: 11 % (ref 3–12)
NEUTROS PCT: 82 % — AB (ref 43–77)
Neutro Abs: 11.2 K/uL — ABNORMAL HIGH (ref 1.7–7.7)
Neutro Abs: 10.8 10*3/uL — ABNORMAL HIGH (ref 1.7–7.7)
Neutrophils Relative %: 75 % (ref 43–77)
PLATELETS: 170 10*3/uL (ref 150–400)
Platelets: 182 K/uL (ref 150–400)
RBC: 4.26 MIL/uL (ref 4.22–5.81)
RBC: 4.29 MIL/uL (ref 4.22–5.81)
RDW: 14.2 % (ref 11.5–15.5)
RDW: 13.8 % (ref 11.5–15.5)
WBC: 14.9 K/uL — ABNORMAL HIGH (ref 4.0–10.5)
WBC: 13.2 10*3/uL — ABNORMAL HIGH (ref 4.0–10.5)

## 2014-05-04 LAB — SODIUM, URINE, RANDOM: Sodium, Ur: 39 meq/L

## 2014-05-04 LAB — CK: Total CK: 789 U/L — ABNORMAL HIGH (ref 7–232)

## 2014-05-04 LAB — URINE MICROSCOPIC-ADD ON

## 2014-05-04 LAB — CREATININE, URINE, RANDOM: Creatinine, Urine: 134.13 mg/dL

## 2014-05-04 MED ORDER — SODIUM CHLORIDE 0.9 % IV SOLN: 250.0000 mL | INTRAVENOUS | Status: DC | PRN

## 2014-05-04 MED ORDER — SODIUM CHLORIDE 0.9 % IV BOLUS (SEPSIS)
500.0000 mL | Freq: Once | INTRAVENOUS | Status: AC
  Administered 2014-05-04: 500 mL via INTRAVENOUS

## 2014-05-04 MED ORDER — TRAMADOL HCL 50 MG PO TABS: 50.0000 mg | ORAL_TABLET | Freq: Two times a day (BID) | ORAL | Status: DC | PRN

## 2014-05-04 MED ORDER — TRAMADOL HCL 50 MG PO TABS
50.0000 mg | ORAL_TABLET | Freq: Four times a day (QID) | ORAL | Status: DC
  Administered 2014-05-04 (×2): 50 mg via ORAL
  Filled 2014-05-04 (×2): qty 1

## 2014-05-04 NOTE — Progress Notes (Signed)
Patient ID: Christopher Newman, male   DOB: 01/03/1951, 63 y.o.   MRN: [id number] He is feeling better. BP has been good today. U/O has picked up as well. Appreciate renal and ortho input. Violeta GelinasBurke [name], MD, MPH, FACS Trauma: [phone] General Surgery: [phone]
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
## 2014-05-05 ENCOUNTER — Inpatient Hospital Stay (HOSPITAL_COMMUNITY): Payer: No Typology Code available for payment source

## 2014-05-05 ENCOUNTER — Encounter (HOSPITAL_COMMUNITY)
Admission: AD | Disposition: A | Discharge status: Home / Self Care | Payer: Self-pay | Source: Other Acute Inpatient Hospital

## 2014-05-05 ENCOUNTER — Inpatient Hospital Stay (HOSPITAL_COMMUNITY): Payer: No Typology Code available for payment source | Admitting: Anesthesiology

## 2014-05-05 ENCOUNTER — Encounter (HOSPITAL_COMMUNITY): Payer: Self-pay | Admitting: Anesthesiology

## 2014-05-05 HISTORY — PX: ORIF CLAVICULAR FRACTURE: SHX5055

## 2014-05-05 LAB — CBC
HEMATOCRIT: 36.6 % — AB (ref 39.0–52.0)
Hemoglobin: 12.2 g/dL — ABNORMAL LOW (ref 13.0–17.0)
MCH: 28.6 pg (ref 26.0–34.0)
MCHC: 33.3 g/dL (ref 30.0–36.0)
MCV: 85.9 fL (ref 78.0–100.0)
Platelets: 163 10*3/uL (ref 150–400)
RBC: 4.26 MIL/uL (ref 4.22–5.81)
RDW: 13.3 % (ref 11.5–15.5)
WBC: 12.3 10*3/uL — AB (ref 4.0–10.5)

## 2014-05-05 LAB — COMPREHENSIVE METABOLIC PANEL
ALT: 33 U/L (ref 0–53)
AST: 26 U/L (ref 0–37)
Albumin: 3.1 g/dL — ABNORMAL LOW (ref 3.5–5.2)
Alkaline Phosphatase: 83 U/L (ref 39–117)
Anion gap: 13 (ref 5–15)
BILIRUBIN TOTAL: 1 mg/dL (ref 0.3–1.2)
BUN: 22 mg/dL (ref 6–23)
CO2: 26 meq/L (ref 19–32)
CREATININE: 0.95 mg/dL (ref 0.50–1.35)
Calcium: 9.1 mg/dL (ref 8.4–10.5)
Chloride: 95 mEq/L — ABNORMAL LOW (ref 96–112)
GFR, EST NON AFRICAN AMERICAN: 88 mL/min — AB (ref >90)
Glucose, Bld: 120 mg/dL — ABNORMAL HIGH (ref 70–99)
POTASSIUM: 3.8 meq/L (ref 3.7–5.3)
Sodium: 134 mEq/L — ABNORMAL LOW (ref 137–147)
Total Protein: 6.4 g/dL (ref 6.0–8.3)

## 2014-05-05 LAB — PHOSPHORUS: Phosphorus: 2 mg/dL — ABNORMAL LOW (ref 2.3–4.6)

## 2014-05-05 LAB — PARATHYROID HORMONE, INTACT (NO CA): PTH: 85 pg/mL — ABNORMAL HIGH (ref 14–64)

## 2014-05-05 LAB — IRON AND TIBC
IRON: 34 ug/dL — AB (ref 42–135)
Saturation Ratios: 10 % — ABNORMAL LOW (ref 20–55)
TIBC: 334 ug/dL (ref 215–435)
UIBC: 300 ug/dL (ref 125–400)

## 2014-05-05 LAB — SURGICAL PCR SCREEN
MRSA, PCR: NEGATIVE
Staphylococcus aureus: NEGATIVE

## 2014-05-05 SURGERY — OPEN REDUCTION INTERNAL FIXATION (ORIF) CLAVICULAR FRACTURE
Anesthesia: General | Site: Shoulder | Laterality: Left

## 2014-05-05 MED ORDER — ARTIFICIAL TEARS OP OINT
TOPICAL_OINTMENT | OPHTHALMIC | Status: DC | PRN
  Administered 2014-05-05: 1 via OPHTHALMIC

## 2014-05-05 MED ORDER — NEOSTIGMINE METHYLSULFATE 10 MG/10ML IV SOLN
INTRAVENOUS | Status: DC | PRN
Start: 2014-05-05 — End: 2014-05-05
  Administered 2014-05-05: 4 mg via INTRAVENOUS

## 2014-05-05 MED ORDER — ONDANSETRON HCL 4 MG/2ML IJ SOLN
INTRAMUSCULAR | Status: AC
  Filled 2014-05-05: qty 2

## 2014-05-05 MED ORDER — NEOSTIGMINE METHYLSULFATE 10 MG/10ML IV SOLN
INTRAVENOUS | Status: AC
  Filled 2014-05-05: qty 1

## 2014-05-05 MED ORDER — LIDOCAINE HCL (CARDIAC) 20 MG/ML IV SOLN
INTRAVENOUS | Status: DC | PRN
  Administered 2014-05-05: 60 mg via INTRAVENOUS

## 2014-05-05 MED ORDER — SOTALOL HCL 80 MG PO TABS
40.0000 mg | ORAL_TABLET | Freq: Every day | ORAL | Status: DC
  Administered 2014-05-05 – 2014-05-10 (×6): 40 mg via ORAL
  Filled 2014-05-05 (×8): qty 0.5

## 2014-05-05 MED ORDER — LACTATED RINGERS IV SOLN
INTRAVENOUS | Status: DC
  Administered 2014-05-05: 11:00:00 via INTRAVENOUS

## 2014-05-05 MED ORDER — FENTANYL CITRATE 0.05 MG/ML IJ SOLN
25.0000 ug | INTRAMUSCULAR | Status: DC | PRN
  Administered 2014-05-05: 50 ug via INTRAVENOUS

## 2014-05-05 MED ORDER — ROCURONIUM BROMIDE 100 MG/10ML IV SOLN
INTRAVENOUS | Status: DC | PRN
  Administered 2014-05-05: 30 mg via INTRAVENOUS

## 2014-05-05 MED ORDER — PHENYLEPHRINE HCL 10 MG/ML IJ SOLN
INTRAMUSCULAR | Status: DC | PRN
  Administered 2014-05-05: 120 ug via INTRAVENOUS

## 2014-05-05 MED ORDER — ONDANSETRON HCL 4 MG/2ML IJ SOLN
INTRAMUSCULAR | Status: DC | PRN
  Administered 2014-05-05: 4 mg via INTRAVENOUS

## 2014-05-05 MED ORDER — PROPOFOL 10 MG/ML IV BOLUS
INTRAVENOUS | Status: AC
  Filled 2014-05-05: qty 20

## 2014-05-05 MED ORDER — GLYCOPYRROLATE 0.2 MG/ML IJ SOLN
INTRAMUSCULAR | Status: AC
  Filled 2014-05-05: qty 1

## 2014-05-05 MED ORDER — K PHOS MONO-SOD PHOS DI & MONO 155-852-130 MG PO TABS
500.0000 mg | ORAL_TABLET | Freq: Two times a day (BID) | ORAL | Status: AC
  Administered 2014-05-05: 500 mg via ORAL
  Filled 2014-05-05 (×3): qty 2

## 2014-05-05 MED ORDER — FENTANYL CITRATE 0.05 MG/ML IJ SOLN
INTRAMUSCULAR | Status: AC
  Filled 2014-05-05: qty 5

## 2014-05-05 MED ORDER — FENTANYL CITRATE 0.05 MG/ML IJ SOLN
INTRAMUSCULAR | Status: DC | PRN
  Administered 2014-05-05 (×3): 50 ug via INTRAVENOUS

## 2014-05-05 MED ORDER — MIDAZOLAM HCL 5 MG/5ML IJ SOLN
INTRAMUSCULAR | Status: DC | PRN
  Administered 2014-05-05 (×2): 1 mg via INTRAVENOUS

## 2014-05-05 MED ORDER — GLYCOPYRROLATE 0.2 MG/ML IJ SOLN
INTRAMUSCULAR | Status: AC
  Filled 2014-05-05: qty 3

## 2014-05-05 MED ORDER — LIDOCAINE HCL (CARDIAC) 20 MG/ML IV SOLN
INTRAVENOUS | Status: AC
  Filled 2014-05-05: qty 5

## 2014-05-05 MED ORDER — OXYCODONE HCL 5 MG PO TABS
5.0000 mg | ORAL_TABLET | ORAL | Status: DC | PRN
  Administered 2014-05-05: 5 mg via ORAL
  Administered 2014-05-05 – 2014-05-06 (×2): 10 mg via ORAL
  Administered 2014-05-10 (×3): 5 mg via ORAL
  Filled 2014-05-05: qty 2
  Filled 2014-05-05 (×2): qty 1
  Filled 2014-05-05: qty 2
  Filled 2014-05-05: qty 1

## 2014-05-05 MED ORDER — ROCURONIUM BROMIDE 50 MG/5ML IV SOLN
INTRAVENOUS | Status: AC
  Filled 2014-05-05: qty 1

## 2014-05-05 MED ORDER — CEFAZOLIN SODIUM-DEXTROSE 2-3 GM-% IV SOLR
2.0000 g | Freq: Once | INTRAVENOUS | Status: AC
  Administered 2014-05-05: 2 g via INTRAVENOUS
  Filled 2014-05-05: qty 50

## 2014-05-05 MED ORDER — PROPOFOL 10 MG/ML IV BOLUS
INTRAVENOUS | Status: DC | PRN
  Administered 2014-05-05: 250 mg via INTRAVENOUS

## 2014-05-05 MED ORDER — LACTATED RINGERS IV SOLN
INTRAVENOUS | Status: DC | PRN
  Administered 2014-05-05: 13:00:00 via INTRAVENOUS

## 2014-05-05 MED ORDER — FENTANYL CITRATE 0.05 MG/ML IJ SOLN
INTRAMUSCULAR | Status: AC
  Filled 2014-05-05: qty 2

## 2014-05-05 MED ORDER — SOTALOL HCL 80 MG PO TABS
80.0000 mg | ORAL_TABLET | Freq: Every day | ORAL | Status: DC
  Administered 2014-05-06 – 2014-05-11 (×6): 80 mg via ORAL
  Filled 2014-05-05 (×9): qty 1

## 2014-05-05 MED ORDER — GLYCOPYRROLATE 0.2 MG/ML IJ SOLN
INTRAMUSCULAR | Status: DC | PRN
  Administered 2014-05-05: 0.6 mg via INTRAVENOUS

## 2014-05-05 MED ORDER — SUCCINYLCHOLINE CHLORIDE 20 MG/ML IJ SOLN
INTRAMUSCULAR | Status: DC | PRN
  Administered 2014-05-05: 140 mg via INTRAVENOUS

## 2014-05-05 MED ORDER — LACTATED RINGERS IV SOLN
INTRAVENOUS | Status: DC | PRN
  Administered 2014-05-05: 12:00:00 via INTRAVENOUS

## 2014-05-05 MED ORDER — SODIUM CHLORIDE 0.9 % IR SOLN
Status: DC | PRN
  Administered 2014-05-05: 1

## 2014-05-05 MED ORDER — MIDAZOLAM HCL 2 MG/2ML IJ SOLN
INTRAMUSCULAR | Status: AC
  Filled 2014-05-05: qty 2

## 2014-05-05 MED ORDER — TRAMADOL HCL 50 MG PO TABS
100.0000 mg | ORAL_TABLET | Freq: Four times a day (QID) | ORAL | Status: DC
  Administered 2014-05-05 – 2014-05-11 (×20): 100 mg via ORAL
  Filled 2014-05-05 (×21): qty 2

## 2014-05-05 MED ORDER — ONDANSETRON HCL 4 MG/2ML IJ SOLN: 4.0000 mg | Freq: Four times a day (QID) | INTRAMUSCULAR | Status: DC | PRN

## 2014-05-05 SURGICAL SUPPLY — 69 items
APL SKNCLS STERI-STRIP NONHPOA (GAUZE/BANDAGES/DRESSINGS) ×1
BENZOIN TINCTURE PRP APPL 2/3 (GAUZE/BANDAGES/DRESSINGS) ×2 IMPLANT
BIT DRILL 2.3 QUICK RELEASE (BIT) IMPLANT
BIT DRILL 2.8X5 QR DISP (BIT) ×2 IMPLANT
BIT DRILL 3.0X5 QUICK RELEASE (DRILL) IMPLANT
BRUSH SCRUB DISP (MISCELLANEOUS) ×6 IMPLANT
CATH URET WHISTLE 8FR 331008 (CATHETERS) ×2 IMPLANT
CLOSURE WOUND 1/2 X4 (GAUZE/BANDAGES/DRESSINGS) ×1
COVER SURGICAL LIGHT HANDLE (MISCELLANEOUS) ×6 IMPLANT
DRAPE C-ARM 42X72 X-RAY (DRAPES) ×3 IMPLANT
DRAPE C-ARMOR (DRAPES) ×1 IMPLANT
DRAPE IMP U-DRAPE 54X76 (DRAPES) ×3 IMPLANT
DRAPE INCISE IOBAN 66X45 STRL (DRAPES) ×3 IMPLANT
DRAPE ORTHO SPLIT 77X108 STRL (DRAPES) ×3
DRAPE SURG ORHT 6 SPLT 77X108 (DRAPES) ×1 IMPLANT
DRAPE U-SHAPE 47X51 STRL (DRAPES) ×6 IMPLANT
DRILL 2.3 QUICK RELEASE (BIT) ×3
DRILL 3.0X5 QUICK RELEASE (DRILL) ×3
DRSG EMULSION OIL 3X3 NADH (GAUZE/BANDAGES/DRESSINGS) ×3 IMPLANT
DRSG MEPILEX BORDER 4X8 (GAUZE/BANDAGES/DRESSINGS) ×2 IMPLANT
ELECT NDL TIP 2.8 STRL (NEEDLE) ×1 IMPLANT
ELECT NEEDLE TIP 2.8 STRL (NEEDLE) ×3 IMPLANT
ELECT REM PT RETURN 9FT ADLT (ELECTROSURGICAL) ×3
ELECTRODE REM PT RTRN 9FT ADLT (ELECTROSURGICAL) ×1 IMPLANT
GAUZE SPONGE 4X4 12PLY STRL (GAUZE/BANDAGES/DRESSINGS) ×3 IMPLANT
GLOVE BIO SURGEON STRL SZ7.5 (GLOVE) ×3 IMPLANT
GLOVE BIO SURGEON STRL SZ8 (GLOVE) ×3 IMPLANT
GLOVE BIOGEL PI IND STRL 7.5 (GLOVE) ×1 IMPLANT
GLOVE BIOGEL PI IND STRL 8 (GLOVE) ×1 IMPLANT
GLOVE BIOGEL PI INDICATOR 7.5 (GLOVE) ×2
GLOVE BIOGEL PI INDICATOR 8 (GLOVE) ×2
GOWN STRL REUS W/ TWL LRG LVL3 (GOWN DISPOSABLE) ×2 IMPLANT
GOWN STRL REUS W/ TWL XL LVL3 (GOWN DISPOSABLE) ×1 IMPLANT
GOWN STRL REUS W/TWL LRG LVL3 (GOWN DISPOSABLE) ×9
GOWN STRL REUS W/TWL XL LVL3 (GOWN DISPOSABLE) ×3
KIT BASIN OR (CUSTOM PROCEDURE TRAY) ×3 IMPLANT
KIT ROOM TURNOVER OR (KITS) ×3 IMPLANT
MANIFOLD NEPTUNE II (INSTRUMENTS) ×3 IMPLANT
NDL HYPO 25GX1X1/2 BEV (NEEDLE) ×1 IMPLANT
NEEDLE HYPO 25GX1X1/2 BEV (NEEDLE) ×3 IMPLANT
NS IRRIG 1000ML POUR BTL (IV SOLUTION) ×3 IMPLANT
PACK TOTAL JOINT (CUSTOM PROCEDURE TRAY) ×3 IMPLANT
PACK UNIVERSAL I (CUSTOM PROCEDURE TRAY) ×3 IMPLANT
PAD ARMBOARD 7.5X6 YLW CONV (MISCELLANEOUS) ×6 IMPLANT
PLATE CLAVICLE LEFT 8 HOLE (Plate) ×2 IMPLANT
SCREW BONE NLCKG 3.0X18 HEXA (Screw) IMPLANT
SCREW BONE NONLOCK 3.0X18 (Screw) ×3 IMPLANT
SCREW HEXALOBE LOCKING 3.5X14M (Screw) ×2 IMPLANT
SCREW HEXALOBE LOCKING 3.5X16M (Screw) ×2 IMPLANT
SCREW HEXALOBE NON-LOCK 3.5X14 (Screw) ×2 IMPLANT
SCREW HEXALOBE NON-LOCK 3.5X16 (Screw) ×2 IMPLANT
SCREW LOCK 12X3.5X HEXALOBE (Screw) IMPLANT
SCREW LOCKING 3.5X12 (Screw) ×3 IMPLANT
SCREW NONLOCK HEX 3.5X12 (Screw) ×4 IMPLANT
SPONGE LAP 18X18 X RAY DECT (DISPOSABLE) ×6 IMPLANT
STRIP CLOSURE SKIN 1/2X4 (GAUZE/BANDAGES/DRESSINGS) ×2 IMPLANT
SUCTION FRAZIER TIP 10 FR DISP (SUCTIONS) ×1 IMPLANT
SUT ETHILON 3 0 PS 1 (SUTURE) ×1 IMPLANT
SUT MNCRL AB 3-0 PS2 18 (SUTURE) ×2 IMPLANT
SUT MNCRL AB 4-0 PS2 18 (SUTURE) ×1 IMPLANT
SUT PROLENE 3 0 PS 1 (SUTURE) ×1 IMPLANT
SUT VIC AB 0 CT1 27 (SUTURE) ×3
SUT VIC AB 0 CT1 27XBRD ANBCTR (SUTURE) ×1 IMPLANT
SUT VIC AB 2-0 CT1 27 (SUTURE) ×3
SUT VIC AB 2-0 CT1 TAPERPNT 27 (SUTURE) ×1 IMPLANT
SUT VIC AB 2-0 CT3 27 (SUTURE) IMPLANT
SYR CONTROL 10ML LL (SYRINGE) ×3 IMPLANT
WATER STERILE IRR 1000ML POUR (IV SOLUTION) ×1 IMPLANT
YANKAUER SUCT BULB TIP NO VENT (SUCTIONS) ×3 IMPLANT

## 2014-05-05 NOTE — Anesthesia Procedure Notes (Signed)
Procedure Name: Intubation Date/Time: 05/05/2014 12:30 PM Performed by: Leonel Ramsay'LAUGHLIN, Emad Brechtel H Pre-anesthesia Checklist: Patient identified, Patient being monitored, Emergency Drugs available, Timeout performed and Suction available Patient Re-evaluated:Patient Re-evaluated prior to inductionOxygen Delivery Method: Circle system utilized Preoxygenation: Pre-oxygenation with 100% oxygen Intubation Type: IV induction Laryngoscope Size: Mac and 4 Grade View: Grade II Tube type: Oral Tube size: 7.5 mm Number of attempts: 1 Airway Equipment and Method: Stylet Placement Confirmation: ETT inserted through vocal cords under direct vision,  positive ETCO2 and breath sounds checked- equal and bilateral Secured at: 23 cm Tube secured with: Tape Dental Injury: Teeth and Oropharynx as per pre-operative assessment

## 2014-05-05 NOTE — Anesthesia Postprocedure Evaluation (Signed)
  Anesthesia Post-op Note  Patient: Christopher Newman  Procedure(s) Performed: Procedure(s): OPEN REDUCTION INTERNAL FIXATION (ORIF) CLAVICULAR FRACTURE (Left)  Patient Location: PACU  Anesthesia Type:General  Level of Consciousness: awake  Airway and Oxygen Therapy: Patient Spontanous Breathing  Post-op Pain: mild  Post-op Assessment: Post-op Vital signs reviewed  Post-op Vital Signs: Reviewed  Last Vitals:  Filed Vitals:   05/05/14 1539  BP: 150/85  Pulse: 84  Temp: 37.1 C  Resp: 16    Complications: No apparent anesthesia complications

## 2014-05-05 NOTE — Progress Notes (Signed)
   05/05/14 0300  BiPAP/CPAP/SIPAP  BiPAP/CPAP/SIPAP Pt Type Adult  Mask Type Full face mask  Mask Size Large  Set Rate 0 breaths/min  Respiratory Rate 23 breaths/min  IPAP 6 cmH20  EPAP 6 cmH2O  Flow Rate 3 lpm  BiPAP/CPAP/SIPAP CPAP  Patient Home Equipment No  Auto Titrate No  BiPAP/CPAP /SiPAP Vitals  Pulse Rate 80  Resp (!) 23  SpO2 97 %

## 2014-05-05 NOTE — Transfer of Care (Signed)
Immediate Anesthesia Transfer of Care Note  Patient: Christopher Newman  Procedure(s) Performed: Procedure(s): OPEN REDUCTION INTERNAL FIXATION (ORIF) CLAVICULAR FRACTURE (Left)  Patient Location: PACU  Anesthesia Type:General  Level of Consciousness: awake, alert  and oriented  Airway & Oxygen Therapy: Patient Spontanous Breathing and Patient connected to face mask oxygen  Post-op Assessment: Report given to PACU RN and Post -op Vital signs reviewed and stable  Post vital signs: Reviewed and stable  Complications: No apparent anesthesia complications
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
## 2014-05-06 LAB — BASIC METABOLIC PANEL
ANION GAP: 11 (ref 5–15)
BUN: 17 mg/dL (ref 6–23)
CALCIUM: 9.2 mg/dL (ref 8.4–10.5)
CO2: 30 mEq/L (ref 19–32)
CREATININE: 0.76 mg/dL (ref 0.50–1.35)
Chloride: 93 mEq/L — ABNORMAL LOW (ref 96–112)
GFR calc non Af Amer: >90 mL/min (ref >90)
Glucose, Bld: 97 mg/dL (ref 70–99)
Potassium: 3.8 mEq/L (ref 3.7–5.3)
Sodium: 134 mEq/L — ABNORMAL LOW (ref 137–147)

## 2014-05-06 LAB — RENAL FUNCTION PANEL
ALBUMIN: 3 g/dL — AB (ref 3.5–5.2)
ANION GAP: 22 — AB (ref 5–15)
BUN: 17 mg/dL (ref 6–23)
CO2: 28 mEq/L (ref 19–32)
Calcium: 9.1 mg/dL (ref 8.4–10.5)
Chloride: 88 mEq/L — ABNORMAL LOW (ref 96–112)
Creatinine, Ser: 0.77 mg/dL (ref 0.50–1.35)
GFR calc non Af Amer: >90 mL/min (ref >90)
Glucose, Bld: 99 mg/dL (ref 70–99)
PHOSPHORUS: 2.9 mg/dL (ref 2.3–4.6)
POTASSIUM: 4 meq/L (ref 3.7–5.3)
Sodium: 138 mEq/L (ref 137–147)

## 2014-05-06 LAB — CBC
HCT: 37.3 % — ABNORMAL LOW (ref 39.0–52.0)
Hemoglobin: 12.4 g/dL — ABNORMAL LOW (ref 13.0–17.0)
MCH: 29.1 pg (ref 26.0–34.0)
MCHC: 33.2 g/dL (ref 30.0–36.0)
MCV: 87.6 fL (ref 78.0–100.0)
Platelets: 197 10*3/uL (ref 150–400)
RBC: 4.26 MIL/uL (ref 4.22–5.81)
RDW: 13.6 % (ref 11.5–15.5)
WBC: 10.8 10*3/uL — AB (ref 4.0–10.5)

## 2014-05-06 MED ORDER — MAGNESIUM HYDROXIDE 400 MG/5ML PO SUSP
30.0000 mL | Freq: Once | ORAL | Status: AC
Start: 2014-05-06 — End: 2014-05-06
  Administered 2014-05-06: 30 mL via ORAL
  Filled 2014-05-06: qty 30

## 2014-05-06 MED ORDER — LORAZEPAM 2 MG/ML IJ SOLN
1.0000 mg | Freq: Four times a day (QID) | INTRAMUSCULAR | Status: DC | PRN
  Administered 2014-05-06 – 2014-05-10 (×2): 1 mg via INTRAVENOUS
  Filled 2014-05-06 (×2): qty 1

## 2014-05-06 MED ORDER — POLYSACCHARIDE IRON COMPLEX 150 MG PO CAPS
150.0000 mg | ORAL_CAPSULE | Freq: Two times a day (BID) | ORAL | Status: DC
  Administered 2014-05-06 – 2014-05-11 (×11): 150 mg via ORAL
  Filled 2014-05-06 (×12): qty 1

## 2014-05-06 MED ORDER — POLYETHYLENE GLYCOL 3350 17 G PO PACK
17.0000 g | PACK | Freq: Once | ORAL | Status: AC
  Administered 2014-05-06: 17 g via ORAL
  Filled 2014-05-06 (×2): qty 1

## 2014-05-06 MED ORDER — SIMETHICONE 80 MG PO CHEW
160.0000 mg | CHEWABLE_TABLET | ORAL | Status: DC | PRN
  Administered 2014-05-06 – 2014-05-08 (×2): 160 mg via ORAL
  Filled 2014-05-06 (×4): qty 2

## 2014-05-06 MED ORDER — ATORVASTATIN CALCIUM 20 MG PO TABS
20.0000 mg | ORAL_TABLET | Freq: Every day | ORAL | Status: DC
  Administered 2014-05-06 – 2014-05-11 (×6): 20 mg via ORAL
  Filled 2014-05-06 (×6): qty 1
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
## 2014-05-07 ENCOUNTER — Inpatient Hospital Stay (HOSPITAL_COMMUNITY): Payer: No Typology Code available for payment source

## 2014-05-07 MED ORDER — SODIUM CHLORIDE 0.9 % IV SOLN: 250.0000 mL | INTRAVENOUS | Status: DC | PRN

## 2014-05-07 MED ORDER — BISACODYL 10 MG RE SUPP
10.0000 mg | Freq: Once | RECTAL | Status: AC
  Administered 2014-05-07: 10 mg via RECTAL
  Filled 2014-05-07: qty 1

## 2014-05-07 MED ORDER — METOCLOPRAMIDE HCL 5 MG/ML IJ SOLN
10.0000 mg | Freq: Four times a day (QID) | INTRAMUSCULAR | Status: DC
  Administered 2014-05-07: 10 mg via INTRAVENOUS
  Filled 2014-05-07: qty 2

## 2014-05-07 NOTE — Progress Notes (Signed)
     Subjective:  Patient reports pain as mild.  Complains of distension and nauea.  Denies flatus.    Objective:   VITALS:   Filed Vitals:   05/06/14 2258 05/07/14 0621 05/07/14 0936 05/07/14 0957  BP:  125/80  133/72  Pulse: 80 60  74  Temp:  97.6 F (36.4 C)    TempSrc:  Oral    Resp: 16 16    Height:      Weight:      SpO2: 93% 100% 94%     Neurologically intact Right upper extremity sensation intact, although reports mild numbness in small finger.  Intact over ring.  Able to F/E/Abduct right hand. Substantial abdominal distension, mild tenderness, but no rebound.  Lab Results  Component Value Date   WBC 10.8* 05/06/2014   HGB 12.4* 05/06/2014   HCT 37.3* 05/06/2014   MCV 87.6 05/06/2014   PLT 197 05/06/2014   BMET    Component Value Date/Time   NA 138 05/06/2014 0540   NA 134* 05/06/2014 0540   K 4.0 05/06/2014 0540   K 3.8 05/06/2014 0540   CL 88* 05/06/2014 0540   CL 93* 05/06/2014 0540   CO2 28 05/06/2014 0540   CO2 30 05/06/2014 0540   GLUCOSE 99 05/06/2014 0540   GLUCOSE 97 05/06/2014 0540   BUN 17 05/06/2014 0540   BUN 17 05/06/2014 0540   CREATININE 0.77 05/06/2014 0540   CREATININE 0.76 05/06/2014 0540   CALCIUM 9.1 05/06/2014 0540   CALCIUM 9.2 05/06/2014 0540   GFRNONAA >90 05/06/2014 0540   GFRNONAA >90 05/06/2014 0540   GFRAA >90 05/06/2014 0540   GFRAA >90 05/06/2014 0540     Assessment/Plan: 2 Days Post-Op   Principal Problem:   Multiple fractures of ribs of left side Active Problems:   Atrial fibrillation   Clavicle fracture   Rib fractures   Post op ileus developing.  Made him NPO x meds, started reglan, continue with bowel program.    Kathia Covington P 05/07/2014, 10:11 AM   Teryl LucyJoshua Loeta Herst, MD Cell [phone]

## 2014-05-07 NOTE — Progress Notes (Signed)
Patient ID: Christopher Newman, male   DOB: 10/11/1950, 63 y.o.   MRN: [id number] 2 Days Post-Op  Subjective: Pt c/o not having a BM and now feels a little nauseated and very bloated.  No pain issues  Objective: Vital signs in last 24 hours: Temp:  [97.6 F (36.4 C)-98.2 F (36.8 C)] 97.6 F (36.4 C) (12/20 0621) Pulse Rate:  [60-80] 60 (12/20 0621) Resp:  [16-18] 16 (12/20 0621) BP: (122-163)/(79-89) 125/80 mmHg (12/20 0621) SpO2:  [92 %-100 %] 94 % (12/20 0936) Last BM Date: 05/02/14  Intake/Output from previous day: 12/19 0701 - 12/20 0700 In: 360 [P.O.:360] Out: 600 [Urine:600] Intake/Output this shift:    PE: Gen: sleepy from meds Heart: regular Lungs: CTAB Abd: distended and tympanitic, few BS, reducible umbilical hernia   Lab Results:   Recent Labs  05/05/14 0227 05/06/14 0540  WBC 12.3* 10.8*  HGB 12.2* 12.4*  HCT 36.6* 37.3*  PLT 163 197   BMET  Recent Labs  05/05/14 0227 05/06/14 0540  NA 134* 138  134*  K 3.8 4.0  3.8  CL 95* 88*  93*  CO2 26 28  30   GLUCOSE 120* 99  97  BUN 22 17  17   CREATININE 0.95 0.77  0.76  CALCIUM 9.1 9.1  9.2   PT/INR No results for input(s): LABPROT, INR in the last 72 hours. CMP     Component Value Date/Time   NA 138 05/06/2014 0540   NA 134* 05/06/2014 0540   K 4.0 05/06/2014 0540   K 3.8 05/06/2014 0540   CL 88* 05/06/2014 0540   CL 93* 05/06/2014 0540   CO2 28 05/06/2014 0540   CO2 30 05/06/2014 0540   GLUCOSE 99 05/06/2014 0540   GLUCOSE 97 05/06/2014 0540   BUN 17 05/06/2014 0540   BUN 17 05/06/2014 0540   CREATININE 0.77 05/06/2014 0540   CREATININE 0.76 05/06/2014 0540   CALCIUM 9.1 05/06/2014 0540   CALCIUM 9.2 05/06/2014 0540   PROT 6.4 05/05/2014 0227   ALBUMIN 3.0* 05/06/2014 0540   AST 26 05/05/2014 0227   ALT 33 05/05/2014 0227   ALKPHOS 83 05/05/2014 0227   BILITOT 1.0 05/05/2014 0227   GFRNONAA >90 05/06/2014 0540   GFRNONAA >90 05/06/2014 0540   GFRAA >90 05/06/2014 0540    GFRAA >90 05/06/2014 0540   Lipase  No results found for: LIPASE     Studies/Results: Dg Clavicle Left  05/05/2014   CLINICAL DATA:  Postop.  EXAM: LEFT CLAVICLE - 2+ VIEWS  COMPARISON:  Preoperative radiographs 05/04/2014.  FINDINGS: Plate and screw fixation of the mid clavicle fracture appears satisfactory, with improved position and alignment.  IMPRESSION: As above.   Electronically Signed   By: Davonna BellingJohn  Curnes M.D.   On: 05/05/2014 23:17   Dg Clavicle Left  05/05/2014   CLINICAL DATA:  ORIF left clavicle  EXAM: DG C-ARM 61-120 MIN; LEFT CLAVICLE - 2+ VIEWS  FLUOROSCOPY TIME:  8 seconds  COMPARISON:  None  FINDINGS: Mid left clavicular fracture transfixed by a malleable plate and multiple interlocking screws along the superior aspect of the clavicle. Near anatomic alignment. No hardware failure or complication.  IMPRESSION: ORIF of a left mid clavicular fracture.   Electronically Signed   By: Elige KoHetal  Patel   On: 05/05/2014 15:29   [place] Renal  05/05/2014   CLINICAL DATA:  Acute renal insufficiency.  Initial encounter.  EXAM: RENAL/URINARY TRACT ULTRASOUND COMPLETE  COMPARISON:  CT of the abdomen and pelvis  performed 12/11/2011  FINDINGS: Right Kidney:  Length: 13.7 cm. Mildly increased parenchymal echogenicity noted. No hydronephrosis visualized. A 3.1 x 2.4 x 2.5 cm exophytic cyst is noted at the upper pole of the right kidney.  Left Kidney:  Length: 14.9 cm. Mildly increased parenchymal echogenicity noted. No mass or hydronephrosis visualized.  Bladder:  Decompressed, with a Foley catheter in place.  IMPRESSION: 1. Mildly increased renal parenchymal echogenicity raises concern for medical renal disease. 2. No evidence of hydronephrosis. 3. Right renal cyst again noted.   Electronically Signed   By: Roanna RaiderJeffery  Chang M.D.   On: 05/05/2014 23:20   Dg Chest Port 1 View  05/05/2014   CLINICAL DATA:  Postop clavicle fracture, multiple left rib fractures evaluate for pneumothorax  EXAM: PORTABLE CHEST -  1 VIEW  COMPARISON:  05/05/2014, 05/03/2014  FINDINGS: Left lung opacities likely reflecting an element of chronic interstitial disease with superimposed left basilar atelectasis versus contusion. Multiple left-sided rib fractures are again noted. There is a small left pleural effusion. There is no pneumothorax. Stable cardiomediastinal silhouette.  ORIF left midclavicular fracture.  IMPRESSION: Left basilar opacity likely reflecting atelectasis versus contusion. Multiple left-sided rib fractures. No left pneumothorax.   Electronically Signed   By: Elige KoHetal  Patel   On: 05/05/2014 15:57   Dg C-arm 61-120 Min  05/05/2014   CLINICAL DATA:  ORIF left clavicle  EXAM: DG C-ARM 61-120 MIN; LEFT CLAVICLE - 2+ VIEWS  FLUOROSCOPY TIME:  8 seconds  COMPARISON:  None  FINDINGS: Mid left clavicular fracture transfixed by a malleable plate and multiple interlocking screws along the superior aspect of the clavicle. Near anatomic alignment. No hardware failure or complication.  IMPRESSION: ORIF of a left mid clavicular fracture.   Electronically Signed   By: Elige KoHetal  Patel   On: 05/05/2014 15:29    Anti-infectives: Anti-infectives    Start     Dose/Rate Route Frequency Ordered Stop   05/05/14 1100  [MAR Hold]  ceFAZolin (ANCEF) IVPB 2 g/50 mL premix     (MAR Hold since 05/05/14 1051)   2 g100 mL/hr over 30 Minutes Intravenous  Once 05/05/14 0901 05/05/14 1250       Assessment/Plan  [facility] Multiple L rib FXs - pulmonary toilet, add BDs and scheduled Ultram L clavicle FX - fixed 05-05-14 AKI - Cr normal.  Renal dc foley yesterday and he is voiding well. CV - BP normal right now, antihypertensives still on hold VTE - restart xarelto, d/w ortho FEN - mild ileus, cont diet for now and give suppository.  Ambulate TID in halls DIspo - PT/OT. Hopefully home in next 24-48 hours pending bowel function.  No follow up or needs from therapy standpoint.  LOS: 5 days    Roni Friberg E 05/07/2014, 9:45 AM Pager:  [phone]
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
## 2014-05-08 ENCOUNTER — Inpatient Hospital Stay (HOSPITAL_COMMUNITY): Payer: No Typology Code available for payment source

## 2014-05-08 ENCOUNTER — Encounter (HOSPITAL_COMMUNITY): Payer: Self-pay | Admitting: Orthopedic Surgery

## 2014-05-08 LAB — BASIC METABOLIC PANEL
ANION GAP: 14 (ref 5–15)
BUN: 15 mg/dL (ref 6–23)
CO2: 30 mEq/L (ref 19–32)
CREATININE: 0.64 mg/dL (ref 0.50–1.35)
Calcium: 9 mg/dL (ref 8.4–10.5)
Chloride: 93 mEq/L — ABNORMAL LOW (ref 96–112)
GFR calc non Af Amer: >90 mL/min (ref >90)
Glucose, Bld: 98 mg/dL (ref 70–99)
Potassium: 3.3 mEq/L — ABNORMAL LOW (ref 3.7–5.3)
Sodium: 137 mEq/L (ref 137–147)

## 2014-05-08 MED ORDER — POTASSIUM CHLORIDE 10 MEQ/100ML IV SOLN
10.0000 meq | INTRAVENOUS | Status: AC
  Administered 2014-05-08 (×2): 10 meq via INTRAVENOUS
  Filled 2014-05-08 (×2): qty 100

## 2014-05-08 MED ORDER — METOCLOPRAMIDE HCL 5 MG/ML IJ SOLN
5.0000 mg | Freq: Four times a day (QID) | INTRAMUSCULAR | Status: DC
  Administered 2014-05-08 – 2014-05-10 (×7): 5 mg via INTRAVENOUS
  Filled 2014-05-08: qty 1
  Filled 2014-05-08: qty 2
  Filled 2014-05-08: qty 1
  Filled 2014-05-08: qty 2
  Filled 2014-05-08 (×4): qty 1
  Filled 2014-05-08 (×2): qty 2
  Filled 2014-05-08 (×2): qty 1
  Filled 2014-05-08: qty 2

## 2014-05-08 MED ORDER — HYDROMORPHONE HCL 1 MG/ML IJ SOLN: 1.0000 mg | INTRAMUSCULAR | Status: DC | PRN

## 2014-05-08 MED ORDER — POTASSIUM CHLORIDE IN NACL 20-0.9 MEQ/L-% IV SOLN
INTRAVENOUS | Status: DC
  Administered 2014-05-08: 100 mL/h via INTRAVENOUS
  Administered 2014-05-09: 01:00:00 via INTRAVENOUS
  Filled 2014-05-08 (×7): qty 1000

## 2014-05-08 MED ORDER — FLEET ENEMA 7-19 GM/118ML RE ENEM
1.0000 | ENEMA | Freq: Every day | RECTAL | Status: DC
Start: 2014-05-08 — End: 2014-05-11
  Administered 2014-05-08 – 2014-05-09 (×2): 1 via RECTAL
  Filled 2014-05-08 (×6): qty 1

## 2014-05-08 NOTE — Progress Notes (Signed)
Placed pt on CPAP for the night. Pt tolerating well at this time.  

## 2014-05-08 NOTE — Progress Notes (Signed)
[facility]  Progress Note   LOS: 6 days   Subjective: Pt feels much better. IS up to 1500, no SOB off O2.  No N/V, abdominal distension and pain improved.  Thirsty.  Daughter and grandkids at bedside.  Had 2 BM yesterday with suppositories, minimal flatus.  Ambulating OOB some.    Objective: Vital signs in last 24 hours: Temp:  [97.5 F (36.4 C)-98.7 F (37.1 C)] 97.5 F (36.4 C) (12/21 0531) Pulse Rate:  [66-76] 66 (12/21 0531) Resp:  [16-18] 16 (12/21 0531) BP: (117-141)/(72-87) 117/76 mmHg (12/21 0531) SpO2:  [94 %-98 %] 98 % (12/21 0531) Last BM Date: 05/07/14  Lab Results:  CBC  Recent Labs  05/06/14 0540  WBC 10.8*  HGB 12.4*  HCT 37.3*  PLT 197   BMET  Recent Labs  05/06/14 0540 05/08/14 0539  NA 138  134* 137  K 4.0  3.8 3.3*  CL 88*  93* 93*  CO2 28  30 30   GLUCOSE 99  97 98  BUN 17  17 15   CREATININE 0.77  0.76 0.64  CALCIUM 9.1  9.2 9.0    Imaging: Dg Abd 2 Views  05/07/2014   CLINICAL DATA:  One thousand a bowel movement since Tuesday. Stomach pain.  EXAM: ABDOMEN - 2 VIEW  COMPARISON:  None.  FINDINGS: There is gaseous distension of the colon to the level of the rectum. There is a moderate amount of stool in the ascending colon. There is no evidence of free air. No radio-opaque calculi or other significant radiographic abnormality is seen.  IMPRESSION: 1. Gaseous distention of the colon to the level of the rectum with a moderate amount of stool in the ascending colon. The appearance is concerning for a colonic ileus.   Electronically Signed   By: Elige KoHetal  Patel   On: 05/07/2014 15:07     PE: General: pleasant, WD/WN white male who is laying in bed in NAD Heart: regular, rate, and rhythm.  Normal s1,s2. No obvious murmurs, gallops, or rubs noted.  Palpable radial and pedal pulses bilaterally Lungs: CTAB, no wheezes, rhonchi, or rales noted.  Respiratory effort nonlabored, Is up to 1500 Abd: soft, distended, NT,  +BS, no masses, hernias, or organomegaly MS: all 4 extremities are symmetrical with no cyanosis, clubbing, or edema. Skin: warm and dry with no masses, lesions, or rashes Psych: A&Ox3 with an appropriate affect.   Assessment/Plan: [facility] Multiple L rib FXs - pulmonary toilet, add BDs and scheduled Ultram L clavicle FX - ORIF 05/05/14 - Handy AKI - Cr normal. Renal dc foley yesterday and he is voiding well. CV - BP normal right now, antihypertensives still on hold Colonic ileus - suppository/enemas. Recheck KUB in am.  Ambulate TID in halls.  Limit narcotics - only on ultram for pain. VTE - restart xarelto, d/w ortho FEN - Sips/chips only, Resume IVF, Hypokalemia treated, d/c o2 monitor if able Dispo - PT/OT - intermittent supervision at this time. D/c pending bowel function. No follow up or needs from therapy standpoint.   Aris GeorgiaMegan Dort, PA-C Pager: [phone] General Trauma PA Pager: [phone]   05/08/2014

## 2014-05-09 ENCOUNTER — Inpatient Hospital Stay (HOSPITAL_COMMUNITY): Payer: No Typology Code available for payment source

## 2014-05-09 DIAGNOSIS — K567 Ileus, unspecified: Secondary | ICD-10-CM | POA: Diagnosis not present

## 2014-05-09 MED ORDER — NEOSTIGMINE METHYLSULFATE 10 MG/10ML IV SOLN
2.0000 mg | Freq: Once | INTRAVENOUS | Status: AC
  Administered 2014-05-09: 2 mg via INTRAVENOUS
  Filled 2014-05-09 (×2): qty 2

## 2014-05-09 MED ORDER — BISACODYL 10 MG RE SUPP
10.0000 mg | Freq: Once | RECTAL | Status: AC
  Administered 2014-05-09: 10 mg via RECTAL
  Filled 2014-05-09: qty 1

## 2014-05-09 MED ORDER — GLYCERIN (LAXATIVE) 2.1 G RE SUPP
1.0000 | Freq: Once | RECTAL | Status: DC
  Filled 2014-05-09: qty 1

## 2014-05-09 MED ORDER — ATROPINE SULFATE 0.1 MG/ML IJ SOLN
0.5000 mg | INTRAMUSCULAR | Status: DC | PRN
  Filled 2014-05-09: qty 10

## 2014-05-09 NOTE — Progress Notes (Signed)
Patient ID: Christopher Newman, male   DOB: 09/29/1950, 63 y.o.   MRN: [id number]   LOS: 7 days   Subjective: NSC, still bloated.   Objective: Vital signs in last 24 hours: Temp:  [98 F (36.7 C)-98.2 F (36.8 C)] 98 F (36.7 C) (12/22 0435) Pulse Rate:  [64-75] 64 (12/22 0435) Resp:  [18] 18 (12/22 0435) BP: (120-144)/(75-85) 120/75 mmHg (12/22 0435) SpO2:  [94 %-96 %] 95 % (12/22 0849) Last BM Date: 05/09/14   Radiology Results ACUTE ABDOMEN SERIES (ABDOMEN 2 VIEW & CHEST 1 VIEW)  COMPARISON: Portable abdominal film of May 08, 2014.  FINDINGS: The lungs are adequately inflated. The interstitial markings are coarse. There is pleural fluid versus pleural thickening along the left lateral thoracic wall. There is no pneumothorax. The cardiac silhouette is mildly enlarged. The central pulmonary vascularity is prominent. An orthopedic plate with cortical screws is present involving the left clavicular shaft. There are lateral left rib deformities which have been previously described.  Within the abdomen there are loops of moderately distended gas-filled colon in the mid and upper abdomen. There is minimal gas and stool in the rectum. There is no small bowel obstruction. There are surgical clips in the gallbladder fossa. There are degenerative changes of the lumbar spine.  IMPRESSION: 1. Findings consistent with a colonic ileus. The volume of colonic gas may have slightly decreased since the previous study. There is no evidence of perforation. 2. Chronically increased interstitial markings predominantly in the left lung. These are more conspicuous today in part due to technical factors. There is increased conspicuity of pleural thickening along the lateral thoracic wall with underlying rib deformities noted.   Electronically Signed  By: David SwazilandJordan  On: 05/09/2014 08:10   Physical Exam General appearance: alert and no distress Resp: clear to auscultation  bilaterally Cardio: regular rate and rhythm GI: Soft, moderate distension, +BS, some tympanitic   Assessment/Plan: [facility] Multiple L rib FXs - pulmonary toilet, add BDs and scheduled Ultram L clavicle FX - ORIF 05/05/14 - Handy AKI - Resolved Colonic ileus - suppository/enemas. Recheck KUB in am. Ambulate TID in halls. Limit narcotics - only on ultram for pain. VTE - restart xarelto, d/w ortho FEN - Sips/chips only, Resume IVF, Hypokalemia treated, d/c o2 monitor if able Dispo - Just need to wait out the ileus    Freeman CaldronMichael J. Kikuye Korenek, PA-C Pager: [phone] General Trauma PA Pager: [phone]  05/09/2014
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
## 2014-05-10 DIAGNOSIS — N179 Acute kidney failure, unspecified: Secondary | ICD-10-CM | POA: Diagnosis not present

## 2014-05-10 DIAGNOSIS — E876 Hypokalemia: Secondary | ICD-10-CM | POA: Diagnosis not present

## 2014-05-10 LAB — BASIC METABOLIC PANEL
ANION GAP: 10 (ref 5–15)
BUN: 9 mg/dL (ref 6–23)
CALCIUM: 8.3 mg/dL — AB (ref 8.4–10.5)
CO2: 23 mmol/L (ref 19–32)
Chloride: 100 meq/L (ref 96–112)
Creatinine, Ser: 0.78 mg/dL (ref 0.50–1.35)
Glucose, Bld: 88 mg/dL (ref 70–99)
Potassium: 3.2 mmol/L — ABNORMAL LOW (ref 3.5–5.1)
Sodium: 133 mmol/L — ABNORMAL LOW (ref 135–145)

## 2014-05-10 MED ORDER — POTASSIUM CHLORIDE CRYS ER 20 MEQ PO TBCR
40.0000 meq | EXTENDED_RELEASE_TABLET | Freq: Two times a day (BID) | ORAL | Status: DC
  Administered 2014-05-10 – 2014-05-11 (×3): 40 meq via ORAL
  Filled 2014-05-10 (×5): qty 2

## 2014-05-10 MED ORDER — POLYETHYLENE GLYCOL 3350 17 G PO PACK
17.0000 g | PACK | Freq: Every day | ORAL | Status: DC
  Administered 2014-05-11: 17 g via ORAL
  Filled 2014-05-10 (×2): qty 1

## 2014-05-10 MED ORDER — DOCUSATE SODIUM 100 MG PO CAPS
200.0000 mg | ORAL_CAPSULE | Freq: Two times a day (BID) | ORAL | Status: DC
  Administered 2014-05-10 – 2014-05-11 (×3): 200 mg via ORAL
  Filled 2014-05-10 (×3): qty 2

## 2014-05-10 MED ORDER — RIVAROXABAN 20 MG PO TABS
20.0000 mg | ORAL_TABLET | Freq: Every day | ORAL | Status: DC
  Administered 2014-05-10: 20 mg via ORAL
  Filled 2014-05-10 (×2): qty 1
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
## 2014-05-10 NOTE — Progress Notes (Signed)
Orthopaedic Trauma Service (OTS)  Subjective: Feeling much better post clavicle repair  Objective:  Physical Exam Ulnar paresthesia, intact but weak 4/5 grip strength & intrinsics, FDP to small UEx shoulder, elbow, wrist, digits- no skin wounds, nontender, no instability, no blocks to motion  Sens  Ax/R/M intact  Mot   Ax/ R/ PIN/ M/ AIN intact  Rad 2+  Assessment/Plan: Ulnar paresthesia and weakness, mild Improving pain F/u in 7-10 days, sling when OOB or chair for comfort  Myrene GalasMichael Michell Kader, MD [facility] [phone] (p)

## 2014-05-10 NOTE — Progress Notes (Signed)
Pt. Refused cpap for tonight. RT informed pt. To notify if he changed his mind. 

## 2014-05-10 NOTE — Progress Notes (Addendum)
Called by Fleet Contrasachel, RN at 2222 hrs  that neostigmine 2mg  IV ordered for pt was received from pharmacy for  administration on floor for paralytic ileus. .  Pt returned to bed, procedure, plan of administration, side effects discussed with pt with return verbal  acknowlegement  and agreement of pt to proceed with drug. Pt on cardiac monitor, O2 sat monitor,  current VS obtained with pt in SR rate 76, patency of Left AC PIV checked.  Med administered at 2300 over one minute.  HR at 2310  decreased to a low of 54, with transient feeling of weakness, dizziness last less than 30 seconds and self resolving.  Pt monitored by RRT for 30 minutes prior to handoff report given to Fleet Contrasachel, Charity fundraiserN.  Pt expelling copious flatus and liquid green stool at 2330 hrs.   05/10/14  0300 F/U   Pt states he has had 4 liquid green bowel movements and continued flatus since neostigmine administration at 2300 hrs.  Abdomin remains distended, active bowel sounds with tympany.  HR SR 74.    WIll follow as needed.

## 2014-05-11 ENCOUNTER — Inpatient Hospital Stay (HOSPITAL_COMMUNITY): Payer: No Typology Code available for payment source

## 2014-05-11 LAB — BASIC METABOLIC PANEL
Anion gap: 12 (ref 5–15)
BUN: 11 mg/dL (ref 6–23)
CALCIUM: 8.7 mg/dL (ref 8.4–10.5)
CO2: 20 mmol/L (ref 19–32)
Chloride: 102 mEq/L (ref 96–112)
Creatinine, Ser: 0.77 mg/dL (ref 0.50–1.35)
GFR calc non Af Amer: >90 mL/min (ref >90)
GLUCOSE: 73 mg/dL (ref 70–99)
Potassium: 4.4 mmol/L (ref 3.5–5.1)
SODIUM: 134 mmol/L — AB (ref 135–145)

## 2014-05-11 MED ORDER — POLYETHYLENE GLYCOL 3350 17 G PO PACK: 17.0000 g | PACK | Freq: Every day | ORAL | Status: DC

## 2014-05-11 MED ORDER — OXYCODONE-ACETAMINOPHEN 5-325 MG PO TABS: 1.0000 | ORAL_TABLET | ORAL | Status: DC | PRN
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
## 2014-05-11 NOTE — Progress Notes (Signed)
UR completed.  Pt planned for d/c according to MD notes. Therapy [facility] or [facility].  Carlyle LipaMichelle Jaquille Kau, RN BSN MHA CCM Trauma/Neuro ICU Case Manager [phone]

## 2014-05-11 NOTE — Progress Notes (Signed)
6 Days Post-Op  Subjective: Feeling much better Denies abdominal pain Passing flatus  Objective: Vital signs in last 24 hours: Temp:  [97.9 F (36.6 C)-98.1 F (36.7 C)] 97.9 F (36.6 C) (12/24 0621) Pulse Rate:  [65-68] 66 (12/24 0621) Resp:  [16] 16 (12/24 0621) BP: (146-153)/(84-86) 146/84 mmHg (12/24 0621) SpO2:  [97 %] 97 % (12/24 0904) Last BM Date: 05/10/14  Intake/Output from previous day: 12/23 0701 - 12/24 0700 In: 720 [P.O.:720] Out: -  Intake/Output this shift: Total I/O In: 240 [P.O.:240] Out: -   Abdomen obese, non tender, good BS, distension less Lungs clear  Lab Results:  No results for input(s): WBC, HGB, HCT, PLT in the last 72 hours. BMET  Recent Labs  05/10/14 0412 05/11/14 0610  NA 133* 134*  K 3.2* 4.4  CL 100 102  CO2 23 20  GLUCOSE 88 73  BUN 9 11  CREATININE 0.78 0.77  CALCIUM 8.3* 8.7   PT/INR No results for input(s): LABPROT, INR in the last 72 hours. ABG No results for input(s): PHART, HCO3 in the last 72 hours.  Invalid input(s): PCO2, PO2  Studies/Results: Dg Abd 1 View  05/11/2014   CLINICAL DATA:  Ileus.  EXAM: ABDOMEN - 1 VIEW  COMPARISON:  05/09/2014.  FINDINGS: Surgical clips right upper quadrant. Gas pattern nonspecific. Colon is not significantly distended on today's exam. No free air or pneumatosis. No pathologic abdominal calcifications. Degenerative changes lumbar spine  IMPRESSION: Nonspecific gas pattern. Colon is not significantly distended on today's exam.   Electronically Signed   By: Maisie Fushomas  Register   On: 05/11/2014 08:42    Anti-infectives: Anti-infectives    Start     Dose/Rate Route Frequency Ordered Stop   05/05/14 1100  [MAR Hold]  ceFAZolin (ANCEF) IVPB 2 g/50 mL premix     (MAR Hold since 05/05/14 1051)   2 g100 mL/hr over 30 Minutes Intravenous  Once 05/05/14 0901 05/05/14 1250      Assessment/Plan: s/p Procedure(s): OPEN REDUCTION INTERNAL FIXATION (ORIF) CLAVICULAR FRACTURE (Left)  KUB  shows colon non longer distended His pain is well controlled Will discharge home today  LOS: 9 days    Christopher Newman A 05/11/2014
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
## 2014-05-12 ENCOUNTER — Other Ambulatory Visit: Payer: Self-pay | Admitting: Pulmonary Disease

## 2014-06-06 ENCOUNTER — Emergency Department (HOSPITAL_COMMUNITY)
Admission: EM | Admit: 2014-06-06 | Discharge: 2014-06-06 | Disposition: A | Discharge status: Home / Self Care | Payer: Medicare Other | Attending: Emergency Medicine | Admitting: Emergency Medicine

## 2014-06-06 ENCOUNTER — Emergency Department (HOSPITAL_COMMUNITY): Payer: Medicare Other

## 2014-06-06 ENCOUNTER — Encounter (HOSPITAL_COMMUNITY): Payer: Self-pay

## 2014-06-06 DIAGNOSIS — Z87891 Personal history of nicotine dependence: Secondary | ICD-10-CM | POA: Insufficient documentation

## 2014-06-06 DIAGNOSIS — Z79899 Other long term (current) drug therapy: Secondary | ICD-10-CM | POA: Insufficient documentation

## 2014-06-06 DIAGNOSIS — J45909 Unspecified asthma, uncomplicated: Secondary | ICD-10-CM | POA: Diagnosis not present

## 2014-06-06 DIAGNOSIS — Z8781 Personal history of (healed) traumatic fracture: Secondary | ICD-10-CM | POA: Diagnosis not present

## 2014-06-06 DIAGNOSIS — I10 Essential (primary) hypertension: Secondary | ICD-10-CM | POA: Insufficient documentation

## 2014-06-06 DIAGNOSIS — E785 Hyperlipidemia, unspecified: Secondary | ICD-10-CM | POA: Insufficient documentation

## 2014-06-06 DIAGNOSIS — H538 Other visual disturbances: Secondary | ICD-10-CM | POA: Insufficient documentation

## 2014-06-06 DIAGNOSIS — Z7901 Long term (current) use of anticoagulants: Secondary | ICD-10-CM | POA: Insufficient documentation

## 2014-06-06 DIAGNOSIS — Z8669 Personal history of other diseases of the nervous system and sense organs: Secondary | ICD-10-CM | POA: Diagnosis not present

## 2014-06-06 DIAGNOSIS — R112 Nausea with vomiting, unspecified: Secondary | ICD-10-CM | POA: Diagnosis not present

## 2014-06-06 DIAGNOSIS — I4891 Unspecified atrial fibrillation: Secondary | ICD-10-CM | POA: Diagnosis not present

## 2014-06-06 DIAGNOSIS — R1111 Vomiting without nausea: Secondary | ICD-10-CM

## 2014-06-06 LAB — CBC WITH DIFFERENTIAL/PLATELET
BASOS ABS: 0 10*3/uL (ref 0.0–0.1)
Basophils Relative: 1 % (ref 0–1)
EOS PCT: 2 % (ref 0–5)
Eosinophils Absolute: 0.1 10*3/uL (ref 0.0–0.7)
HCT: 42.4 % (ref 39.0–52.0)
Hemoglobin: 14.3 g/dL (ref 13.0–17.0)
LYMPHS PCT: 18 % (ref 12–46)
Lymphs Abs: 1.4 10*3/uL (ref 0.7–4.0)
MCH: 29.4 pg (ref 26.0–34.0)
MCHC: 33.7 g/dL (ref 30.0–36.0)
MCV: 87.1 fL (ref 78.0–100.0)
MONO ABS: 0.9 10*3/uL (ref 0.1–1.0)
Monocytes Relative: 12 % (ref 3–12)
Neutro Abs: 5.1 10*3/uL (ref 1.7–7.7)
Neutrophils Relative %: 67 % (ref 43–77)
Platelets: 179 10*3/uL (ref 150–400)
RBC: 4.87 MIL/uL (ref 4.22–5.81)
RDW: 14 % (ref 11.5–15.5)
WBC: 7.6 10*3/uL (ref 4.0–10.5)

## 2014-06-06 LAB — COMPREHENSIVE METABOLIC PANEL
ALBUMIN: 4.1 g/dL (ref 3.5–5.2)
ALT: 30 U/L (ref 0–53)
ANION GAP: 9 (ref 5–15)
AST: 28 U/L (ref 0–37)
Alkaline Phosphatase: 214 U/L — ABNORMAL HIGH (ref 39–117)
BILIRUBIN TOTAL: 0.9 mg/dL (ref 0.3–1.2)
BUN: 13 mg/dL (ref 6–23)
CALCIUM: 9.6 mg/dL (ref 8.4–10.5)
CHLORIDE: 102 meq/L (ref 96–112)
CO2: 30 mmol/L (ref 19–32)
Creatinine, Ser: 0.81 mg/dL (ref 0.50–1.35)
GFR calc Af Amer: >90 mL/min (ref >90)
Glucose, Bld: 94 mg/dL (ref 70–99)
Potassium: 3.4 mmol/L — ABNORMAL LOW (ref 3.5–5.1)
Sodium: 141 mmol/L (ref 135–145)
TOTAL PROTEIN: 7.6 g/dL (ref 6.0–8.3)

## 2014-06-06 LAB — LIPASE, BLOOD: Lipase: 28 U/L (ref 11–59)

## 2014-06-06 LAB — PROTIME-INR
INR: 1.56 — ABNORMAL HIGH (ref 0.00–1.49)
Prothrombin Time: 18.8 seconds — ABNORMAL HIGH (ref 11.6–15.2)

## 2014-06-06 LAB — TROPONIN I: Troponin I: <0.03 ng/mL (ref <0.031)

## 2014-06-06 MED ORDER — PANTOPRAZOLE SODIUM 20 MG PO TBEC: 20.0000 mg | DELAYED_RELEASE_TABLET | Freq: Every day | ORAL | Status: DC

## 2014-06-06 MED ORDER — ONDANSETRON HCL 4 MG/2ML IJ SOLN
4.0000 mg | Freq: Once | INTRAMUSCULAR | Status: AC
  Administered 2014-06-06: 4 mg via INTRAVENOUS
  Filled 2014-06-06: qty 2

## 2014-06-06 MED ORDER — PANTOPRAZOLE SODIUM 40 MG PO TBEC
40.0000 mg | DELAYED_RELEASE_TABLET | Freq: Once | ORAL | Status: AC
  Administered 2014-06-06: 40 mg via ORAL
  Filled 2014-06-06: qty 1

## 2014-06-06 MED ORDER — ONDANSETRON 4 MG PO TBDP: 4.0000 mg | ORAL_TABLET | ORAL | Status: DC | PRN
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
## 2014-06-06 NOTE — Discharge Instructions (Signed)

## 2014-07-19 ENCOUNTER — Other Ambulatory Visit: Payer: Self-pay | Admitting: Pulmonary Disease

## 2014-07-19 ENCOUNTER — Telehealth: Payer: Self-pay | Admitting: Pulmonary Disease

## 2014-07-19 NOTE — Telephone Encounter (Signed)
Called made pt aware refill was sent in earlier. Nothing further needed

## 2014-07-21 ENCOUNTER — Telehealth: Payer: Self-pay | Admitting: Pulmonary Disease

## 2014-07-21 NOTE — Telephone Encounter (Signed)
RX was sent in for dulera 07/20/14. Pt is aware. Nothing further needed

## 2014-09-25 ENCOUNTER — Other Ambulatory Visit: Payer: Self-pay | Admitting: Pulmonary Disease

## 2014-09-26 ENCOUNTER — Other Ambulatory Visit: Payer: Self-pay | Admitting: Pulmonary Disease

## 2014-10-26 ENCOUNTER — Encounter: Payer: Self-pay | Admitting: Pulmonary Disease

## 2014-10-26 ENCOUNTER — Ambulatory Visit (INDEPENDENT_AMBULATORY_CARE_PROVIDER_SITE_OTHER): Payer: 59 | Admitting: Pulmonary Disease

## 2014-10-26 VITALS — BP 124/78 | HR 60 | Temp 97.7°F | Ht 68.0 in | Wt 223.0 lb

## 2014-10-26 DIAGNOSIS — R918 Other nonspecific abnormal finding of lung field: Secondary | ICD-10-CM

## 2014-10-26 DIAGNOSIS — Q268 Other congenital malformations of great veins: Secondary | ICD-10-CM | POA: Diagnosis not present

## 2014-10-26 DIAGNOSIS — R0600 Dyspnea, unspecified: Secondary | ICD-10-CM | POA: Diagnosis not present

## 2014-10-26 DIAGNOSIS — J45909 Unspecified asthma, uncomplicated: Secondary | ICD-10-CM

## 2014-10-26 MED ORDER — MOMETASONE FURO-FORMOTEROL FUM 100-5 MCG/ACT IN AERO: 2.0000 | INHALATION_SPRAY | Freq: Two times a day (BID) | RESPIRATORY_TRACT | Status: AC

## 2014-10-26 NOTE — Patient Instructions (Signed)
Continue on your asthma medications. Work on weight reduction as much as you can. Will arrange followup with Dr. Kendrick Fries in one year.

## 2014-10-26 NOTE — Assessment & Plan Note (Signed)
Followed by his cardiologist, Dr.Akbury

## 2014-10-26 NOTE — Progress Notes (Signed)
   Subjective:    Patient ID: Christopher Newman, male    DOB: 03-07-1951, 64 y.o.   MRN: [id number]  HPI The patient comes in today for follow-up of his known asthma, diagnosed by methacholine challenge testing. He also has chronic dyspnea on exertion secondary to his obesity, deconditioning, pulmonary vein stenosis, some type of abnormal process in his left lung, and underlying atrial arrhythmia. He feels that his breathing has remained at his usual baseline since the last visit, and has not had any asthma flare or need for his rescue inhaler. Denies any significant cough or mucus production.   Review of Systems  Constitutional: Negative for fever and unexpected weight change.  HENT: Positive for congestion. Negative for dental problem, ear pain, nosebleeds, postnasal drip, rhinorrhea, sinus pressure, sneezing, sore throat and trouble swallowing.   Eyes: Negative for redness and itching.  Respiratory: Positive for cough and shortness of breath. Negative for chest tightness and wheezing.   Cardiovascular: Negative for palpitations and leg swelling.  Gastrointestinal: Negative for nausea and vomiting.  Genitourinary: Negative for dysuria.  Musculoskeletal: Negative for joint swelling.  Skin: Negative for rash.  Neurological: Negative for headaches.  Hematological: Does not bruise/bleed easily.  Psychiatric/Behavioral: Negative for dysphoric mood. The patient is not nervous/anxious.        Objective:   Physical Exam Obese male in no acute distress Nose without purulence or discharge noted Neck without lymphadenopathy or thyromegaly Chest with minimal left basilar crackles, otherwise clear Heart exam with regular rate and rhythm Lower extremities with mild edema, no cyanosis Alert and oriented, moves all 4 extremities.       Assessment & Plan:

## 2014-10-26 NOTE — Assessment & Plan Note (Signed)
The patient has a history of asthma which was diagnosed by methacholine challenge test in 2012. He has been on a good inhaler regimen, and is not having any issues with bronchospasm. I have asked him to continue on his current meds.

## 2014-10-26 NOTE — Assessment & Plan Note (Signed)
The patient has long-standing chronic dyspnea on exertion that is multifactorial. He is obese, deconditioned, has interstitial changes in the left lung only of unknown origin  and has significant underlying cardiac issues. I do not think his asthma has anything to do with his symptoms of shortness of breath. His PFTs in the past have not been overly impressive, and therefore I do not think his left lung issue is contributing significantly either. I have stressed to him the importance of aggressive weight loss and conditioning.

## 2014-11-08 ENCOUNTER — Ambulatory Visit: Payer: 59 | Admitting: Pulmonary Disease
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
## 2019-11-14 DIAGNOSIS — R918 Other nonspecific abnormal finding of lung field: Secondary | ICD-10-CM | POA: Diagnosis not present
